# Patient Record
Sex: Female | Born: 1950
Health system: Southern US, Community
[De-identification: ages and names within clinical notes are randomized; demographics above are authoritative.]

## PROBLEM LIST (undated history)

## (undated) DIAGNOSIS — T7840XA Allergy, unspecified, initial encounter: Secondary | ICD-10-CM

## (undated) DIAGNOSIS — I10 Essential (primary) hypertension: Secondary | ICD-10-CM

## (undated) HISTORY — PX: BREAST LUMPECTOMY: SHX2

## (undated) HISTORY — DX: Allergy, unspecified, initial encounter: T78.40XA

## (undated) HISTORY — DX: Essential (primary) hypertension: I10

---
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
## 1975-11-06 HISTORY — PX: TUBAL LIGATION: SHX77

## 1985-11-05 HISTORY — PX: CHOLECYSTECTOMY: SHX55

## 1991-11-06 HISTORY — PX: MYOMECTOMY: SHX85

## 2003-03-15 ENCOUNTER — Inpatient Hospital Stay (HOSPITAL_COMMUNITY): Admission: RE | Admit: 2003-03-15 | Discharge: 2003-03-17 | Payer: Self-pay | Admitting: Orthopedic Surgery

## 2003-03-15 ENCOUNTER — Encounter: Payer: Self-pay | Admitting: Orthopedic Surgery

## 2004-07-14 ENCOUNTER — Ambulatory Visit (HOSPITAL_COMMUNITY): Admission: RE | Admit: 2004-07-14 | Discharge: 2004-07-14 | Payer: Self-pay | Admitting: Gastroenterology

## 2007-04-10 ENCOUNTER — Ambulatory Visit: Payer: Self-pay

## 2008-11-05 HISTORY — PX: FOOT SURGERY: SHX648

## 2008-12-21 ENCOUNTER — Emergency Department: Payer: Self-pay | Admitting: Emergency Medicine

## 2011-02-17 ENCOUNTER — Emergency Department: Payer: Self-pay | Admitting: Internal Medicine

## 2011-07-05 ENCOUNTER — Other Ambulatory Visit: Payer: Self-pay | Admitting: Cardiology

## 2011-07-05 DIAGNOSIS — I6789 Other cerebrovascular disease: Secondary | ICD-10-CM

## 2011-07-06 ENCOUNTER — Other Ambulatory Visit (INDEPENDENT_AMBULATORY_CARE_PROVIDER_SITE_OTHER): Payer: 59 | Admitting: *Deleted

## 2011-07-06 DIAGNOSIS — R42 Dizziness and giddiness: Secondary | ICD-10-CM

## 2011-07-06 DIAGNOSIS — I6789 Other cerebrovascular disease: Secondary | ICD-10-CM

## 2011-07-11 ENCOUNTER — Encounter (HOSPITAL_COMMUNITY): Payer: Self-pay | Admitting: Neurology

## 2011-12-12 ENCOUNTER — Other Ambulatory Visit: Payer: Self-pay | Admitting: Internal Medicine

## 2011-12-18 ENCOUNTER — Ambulatory Visit
Admission: RE | Admit: 2011-12-18 | Discharge: 2011-12-18 | Disposition: A | Discharge status: Home / Self Care | Payer: 59 | Source: Ambulatory Visit | Attending: Internal Medicine | Admitting: Internal Medicine

## 2012-02-21 ENCOUNTER — Other Ambulatory Visit: Payer: Self-pay | Admitting: Internal Medicine

## 2012-02-21 ENCOUNTER — Ambulatory Visit
Admission: RE | Admit: 2012-02-21 | Discharge: 2012-02-21 | Disposition: A | Discharge status: Home / Self Care | Payer: 59 | Source: Ambulatory Visit | Attending: Internal Medicine | Admitting: Internal Medicine

## 2012-02-21 DIAGNOSIS — M795 Residual foreign body in soft tissue: Secondary | ICD-10-CM

## 2014-09-23 ENCOUNTER — Ambulatory Visit
Admission: RE | Admit: 2014-09-23 | Discharge: 2014-09-23 | Disposition: A | Discharge status: Home / Self Care | Payer: No Typology Code available for payment source | Source: Ambulatory Visit | Attending: Internal Medicine | Admitting: Internal Medicine

## 2014-09-23 ENCOUNTER — Other Ambulatory Visit: Payer: Self-pay | Admitting: Internal Medicine

## 2014-09-23 DIAGNOSIS — J208 Acute bronchitis due to other specified organisms: Secondary | ICD-10-CM

## 2015-02-04 ENCOUNTER — Encounter (INDEPENDENT_AMBULATORY_CARE_PROVIDER_SITE_OTHER): Payer: Self-pay

## 2015-02-04 ENCOUNTER — Ambulatory Visit (INDEPENDENT_AMBULATORY_CARE_PROVIDER_SITE_OTHER)
Admission: RE | Admit: 2015-02-04 | Discharge: 2015-02-04 | Disposition: A | Discharge status: Home / Self Care | Payer: No Typology Code available for payment source | Source: Ambulatory Visit | Attending: Internal Medicine | Admitting: Internal Medicine

## 2015-02-04 ENCOUNTER — Encounter: Payer: Self-pay | Admitting: Internal Medicine

## 2015-02-04 ENCOUNTER — Ambulatory Visit (INDEPENDENT_AMBULATORY_CARE_PROVIDER_SITE_OTHER): Payer: No Typology Code available for payment source | Admitting: Internal Medicine

## 2015-02-04 VITALS — BP 126/80 | HR 90 | Ht 62.0 in | Wt 248.0 lb

## 2015-02-04 DIAGNOSIS — R7611 Nonspecific reaction to tuberculin skin test without active tuberculosis: Secondary | ICD-10-CM | POA: Insufficient documentation

## 2015-02-04 DIAGNOSIS — A319 Mycobacterial infection, unspecified: Secondary | ICD-10-CM | POA: Diagnosis not present

## 2015-02-04 NOTE — Assessment & Plan Note (Addendum)
Documented for record keeping for now. Recent liver functions were normal but assume she may be at increased risk of altered liver enzymes from medication exposures.

## 2015-02-04 NOTE — Assessment & Plan Note (Signed)
Probably not the cause of positive TB assays and may not be an active infection. We will watch for evidence of ongoing infection, but she is symptom free now after INH 2 months, and erythromycin 2 months. She is not tolerating eryth due to persistent GI upset. No obvious cardiac problem yet from prolonged macrolide therapy. Plan- stop eryth and observe.

## 2015-02-04 NOTE — Progress Notes (Signed)
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 Because of persistent cough this winter chest x-ray was done showing mild central bronchitis with a 7 mm granuloma in the right midlung zone. PPD skin test was strongly positive and Quant interferon gold assay was positive with no known history of TB exposure. She took 2 months of INH but when sputum culture returned showing atypical AFB, treatment was changed to erythromycin. On erythromycin, usually only tolerating two of 3 prescribed tablets daily because of persistent diarrhea. Bronchitis symptoms have now completely cleared with no cough or sputum. She denies night sweats unless she misses her hormone supplement. Weight has increased. No adenopathy. 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 Recent liver functions normal. She says promptly after starting INH a small skin lesion on her right upper anterior chest, and a nodule on her thumb both resolved. She has no known history of TB contact in self or family. CXR 09/24/14 FINDINGS: Cardiomediastinal silhouette is unremarkable. No acute infiltrate or pleural effusion. No pulmonary edema. Minimal central bronchitic changes. Calcified granuloma in right midlung laterally measures 7 mm. Mild degenerative changes mid and lower thoracic spine. IMPRESSION: No acute infiltrate or pulmonary edema. Minimal central bronchitic changes. Electronically Signed  By: Lahoma Crocker M.D.  On: 09/24/2014 08:42  Prior to Admission medications   Medication Sig Start Date End Date Taking? Authorizing Provider  cyanocobalamin (,VITAMIN B-12,) 1000 MCG/ML injection Injection weekly 01/11/15  Yes Historical Provider, MD  ERY-TAB 333 MG EC tablet Take 1 tablet by mouth 3 (three) times daily. 01/21/15  Yes Historical Provider, MD  levothyroxine (SYNTHROID, LEVOTHROID) 112 MCG tablet Take 1 tablet  by mouth daily. 11/15/14  Yes Historical Provider, MD  telmisartan (MICARDIS) 40 MG tablet Take 40 mg by mouth daily.   Yes Historical Provider, MD   Past Medical History  Diagnosis Date  . Hypertension   . Allergy    Past Surgical History  Procedure Laterality Date  . Cholecystectomy  1987  . Tubal ligation  1977  . Breast lumpectomy  1970's   Family History  Problem Relation Age of Onset  . Lung cancer Father     smoker  . Allergies      mother and father side of family  . Diabetes Brother    History   Social History  . Marital Status: Divorced    Spouse Name: N/A  . Number of Children: 0  . Years of Education: N/A   Occupational History  . grief specalist    Social History Main Topics  . Smoking status: Never Smoker   . Smokeless tobacco: Not on file  . Alcohol Use: 0.0 oz/week    0 Standard drinks or equivalent per week     Comment: social(maybe 2 a year)  . Drug Use: No  . Sexual Activity: Not on file   Other Topics Concern  . Not on file   Social History Narrative  . No narrative on file   ROS-see HPI   Negative unless "+" Constitutional:    weight loss, night sweats, fevers, chills, fatigue, lassitude. HEENT:    headaches, difficulty swallowing, tooth/dental problems, sore throat,       sneezing, itching, ear ache, nasal congestion, post nasal drip, snoring CV:    chest pain, orthopnea, PND, swelling in lower extremities, anasarca,  dizziness, palpitations Resp:   shortness of breath with exertion or at rest.                productive cough,   non-productive cough, coughing up of blood.              change in color of mucus.  wheezing.   Skin:    rash or lesions. GI:  No-   heartburn, indigestion, abdominal pain, nausea, vomiting, diarrhea,                 change in bowel habits, loss of appetite GU: dysuria, change in color of urine, no urgency or frequency.   flank pain. MS:   joint pain, stiffness, decreased range of  motion, back pain. Neuro-     nothing unusual Psych:  change in mood or affect.  depression or anxiety.   memory loss.  OBJ- Physical Exam General- Alert, Oriented, Affect-appropriate, Distress- none acute, overweight, looks well Skin- rash-none, lesions- none, excoriation- none Lymphadenopathy- none Head- atraumatic            Eyes- Gross vision intact, PERRLA, conjunctivae and secretions clear            Ears- Hearing, canals-normal            Nose- Clear, no-Septal dev, mucus, polyps, erosion, perforation             Throat- Mallampati II , mucosa clear , drainage- none, tonsils- atrophic Neck- flexible , trachea midline, no stridor , thyroid nl, carotid no bruit Chest - symmetrical excursion , unlabored           Heart/CV- RRR , no murmur , no gallop  , no rub, nl s1 s2                           - JVD- none , edema- none, stasis changes- none, varices- none           Lung- clear to P&A, wheeze- none, cough- none , dullness-none, rub- none           Chest wall-  Abd- soft, no obvious HSM Br/ Gen/ Rectal- Not done, not indicated Extrem- cyanosis- none, clubbing, none, atrophy- none, strength- nl Neuro- grossly intact to observation

## 2015-02-04 NOTE — Assessment & Plan Note (Addendum)
Granuloma seen on chest x-ray may reflect old tuberculosis infection. At age 64 and with history of porphyria complicated in the past by liver disease, we will watch for evidence of active TB but not try to complete an INH prophylaxis treatment course for now. I told her that if there were any questions we would refer her to infectious disease and that option remains available. No evidence of extrapulmonary infection with tuberculosis at this time.

## 2015-02-04 NOTE — Patient Instructions (Signed)
Order- CXR  Dx atypical AFB  Ok to stop erythromycin now and watch symptoms. I especially want to hear if you begin coughing again, have night sweats, swollen nodes at neck, armpit or groin, or rash.

## 2015-02-10 NOTE — Progress Notes (Signed)
Quick Note:  Called and spoke to pt. Informed pt of the results and recs per CY. Pt verbalized understanding and denied any further questions or concerns at this time. ______ 

## 2015-04-07 ENCOUNTER — Ambulatory Visit (INDEPENDENT_AMBULATORY_CARE_PROVIDER_SITE_OTHER): Payer: No Typology Code available for payment source | Admitting: Internal Medicine

## 2015-04-07 ENCOUNTER — Encounter: Payer: Self-pay | Admitting: Internal Medicine

## 2015-04-07 VITALS — BP 126/72 | HR 85 | Ht 62.0 in | Wt 246.0 lb

## 2015-04-07 DIAGNOSIS — A319 Mycobacterial infection, unspecified: Secondary | ICD-10-CM | POA: Diagnosis not present

## 2015-04-07 DIAGNOSIS — R7611 Nonspecific reaction to tuberculin skin test without active tuberculosis: Secondary | ICD-10-CM | POA: Diagnosis not present

## 2015-04-07 NOTE — Progress Notes (Signed)
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 Because of persistent cough this winter chest x-ray was done showing mild central bronchitis with a 7 mm granuloma in the right midlung zone. 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 She took 2 months of INH but when sputum culture returned showing atypical AFB, treatment was changed to erythromycin. On erythromycin, usually only tolerating two of 3 prescribed tablets daily because of persistent diarrhea. Bronchitis symptoms have now completely cleared with no cough or sputum. She denies night sweats unless she misses her hormone supplement. Weight has increased. No adenopathy. 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 Recent liver functions normal. She says promptly after starting INH a small skin lesion on her right upper anterior chest, and a nodule on her thumb both resolved. She has no known history of TB contact in self or family. CXR 09/24/14 FINDINGS: Cardiomediastinal silhouette is unremarkable. No acute infiltrate or pleural effusion. No pulmonary edema. Minimal central bronchitic changes. Calcified granuloma in right midlung laterally measures 7 mm. Mild degenerative changes mid and lower thoracic spine. IMPRESSION: No acute infiltrate or pulmonary edema. Minimal central bronchitic changes. Electronically Signed  By: Lahoma Crocker M.D.  On: 09/24/2014 08:42  04/07/15- 39 yoF never smoker referred courtesy of  Dr Sharol Roussel; Mycobacterium mucogenicum, +PPD, + Quant TB Gold (Had 2 months INH) Reports: cough at times; hot flashes Weight stable Some mild dry cough. No bad cold since last Fall. Few distinct hot flashes with sweats a few weeks ago. No ongoing night sweat, nodes or discolored sputum. CXR 02/04/15 IMPRESSION: 1. Stable  small peripheral right mid lung field nodule. No change from prior exam. Stable left base subsegmental atelectasis and/or scarring. 2. No acute cardiopulmonary disease. Electronically Signed  By: Marcello Moores Register  On: 02/04/2015 13:06  ROS-see HPI   Negative unless "+" Constitutional:    weight loss, night sweats, fevers, chills, fatigue, lassitude. HEENT:    headaches, difficulty swallowing, tooth/dental problems, sore throat,       sneezing, itching, ear ache, nasal congestion, post nasal drip, snoring CV:    chest pain, orthopnea, PND, swelling in lower extremities, anasarca,                                  dizziness, palpitations Resp:   shortness of breath with exertion or at rest.                productive cough,   non-productive cough, coughing up of blood.              change in color of mucus.  wheezing.   Skin:    rash or lesions. GI:  No-   heartburn, indigestion, abdominal pain, nausea, vomiting,  GU:  MS:   joint pain, stiffness,  Neuro-     nothing unusual Psych:  change in mood or affect.  depression or anxiety.   memory loss.  OBJ- Physical Exam General- Alert, Oriented, Affect-appropriate, Distress- none acute, overweight, looks well Skin- rash-none, lesions- none, excoriation- none Lymphadenopathy- none Head- atraumatic            Eyes- Gross vision intact, PERRLA, conjunctivae and secretions clear            Ears- Hearing, canals-normal  Nose- Clear, no-Septal dev, mucus, polyps, erosion, perforation             Throat- Mallampati II , mucosa clear , drainage- none, tonsils- atrophic Neck- flexible , trachea midline, no stridor , thyroid nl, carotid no bruit Chest - symmetrical excursion , unlabored           Heart/CV- RRR , no murmur , no gallop  , no rub, nl s1 s2                           - JVD- none , edema- none, stasis changes- none, varices- none           Lung- clear to P&A, wheeze- none, cough- none , dullness-none, rub- none            Chest wall-  Abd-  Br/ Gen/ Rectal- Not done, not indicated Extrem- cyanosis- none, clubbing, none, atrophy- none, strength- nl Neuro- grossly intact to observation

## 2015-04-07 NOTE — Assessment & Plan Note (Signed)
Non-specific hot flashes. I very much doubt active infection otherwise. We discussed and will continue to watch conservatively.

## 2015-04-07 NOTE — Assessment & Plan Note (Signed)
We continue to watch. Today discussed symptoms of active pulmonary infection.  Plan- We discussed and chose not to resume the INH prophy to complete at least 6 months.

## 2015-04-07 NOTE — Patient Instructions (Signed)
Please call if we can help 

## 2015-07-27 DIAGNOSIS — H40053 Ocular hypertension, bilateral: Secondary | ICD-10-CM | POA: Diagnosis not present

## 2015-07-27 DIAGNOSIS — H2513 Age-related nuclear cataract, bilateral: Secondary | ICD-10-CM | POA: Diagnosis not present

## 2015-08-24 DIAGNOSIS — Z024 Encounter for examination for driving license: Secondary | ICD-10-CM | POA: Diagnosis not present

## 2015-08-24 DIAGNOSIS — Z124 Encounter for screening for malignant neoplasm of cervix: Secondary | ICD-10-CM | POA: Diagnosis not present

## 2015-10-06 DIAGNOSIS — E039 Hypothyroidism, unspecified: Secondary | ICD-10-CM | POA: Diagnosis not present

## 2015-10-06 DIAGNOSIS — N951 Menopausal and female climacteric states: Secondary | ICD-10-CM | POA: Diagnosis not present

## 2015-10-06 DIAGNOSIS — E279 Disorder of adrenal gland, unspecified: Secondary | ICD-10-CM | POA: Diagnosis not present

## 2015-10-06 DIAGNOSIS — E559 Vitamin D deficiency, unspecified: Secondary | ICD-10-CM | POA: Diagnosis not present

## 2015-10-06 DIAGNOSIS — R739 Hyperglycemia, unspecified: Secondary | ICD-10-CM | POA: Diagnosis not present

## 2015-10-06 DIAGNOSIS — E641 Sequelae of vitamin A deficiency: Secondary | ICD-10-CM | POA: Diagnosis not present

## 2015-10-06 DIAGNOSIS — M25569 Pain in unspecified knee: Secondary | ICD-10-CM | POA: Diagnosis not present

## 2015-10-07 ENCOUNTER — Ambulatory Visit (INDEPENDENT_AMBULATORY_CARE_PROVIDER_SITE_OTHER): Payer: Medicare Other | Admitting: Internal Medicine

## 2015-10-07 ENCOUNTER — Encounter: Payer: Self-pay | Admitting: Internal Medicine

## 2015-10-07 VITALS — BP 116/70 | HR 88 | Ht 62.0 in | Wt 265.4 lb

## 2015-10-07 DIAGNOSIS — A319 Mycobacterial infection, unspecified: Secondary | ICD-10-CM | POA: Diagnosis not present

## 2015-10-07 DIAGNOSIS — R7611 Nonspecific reaction to tuberculin skin test without active tuberculosis: Secondary | ICD-10-CM | POA: Diagnosis not present

## 2015-10-07 NOTE — Assessment & Plan Note (Signed)
Clinically she is not acting as if she has a progressive infection and chest x-ray in April supports that impression.

## 2015-10-07 NOTE — Assessment & Plan Note (Signed)
No evidence of active mycobacterial disease

## 2015-10-07 NOTE — Patient Instructions (Signed)
We will follow along for now. If you stay good we won't have to do more.  Please call as needed

## 2015-10-07 NOTE — Progress Notes (Signed)
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 Because of persistent cough this winter chest x-ray was done showing mild central bronchitis with a 7 mm granuloma in the right midlung zone. 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 She took 2 months of INH but when sputum culture returned showing atypical AFB, treatment was changed to erythromycin. On erythromycin, usually only tolerating two of 3 prescribed tablets daily because of persistent diarrhea. Bronchitis symptoms have now completely cleared with no cough or sputum. She denies night sweats unless she misses her hormone supplement. Weight has increased. No adenopathy. 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 Recent liver functions normal. She says promptly after starting INH a small skin lesion on her right upper anterior chest, and a nodule on her thumb both resolved. She has no known history of TB contact in self or family. CXR 09/24/14 FINDINGS: Cardiomediastinal silhouette is unremarkable. No acute infiltrate or pleural effusion. No pulmonary edema. Minimal central bronchitic changes. Calcified granuloma in right midlung laterally measures 7 mm. Mild degenerative changes mid and lower thoracic spine. IMPRESSION: No acute infiltrate or pulmonary edema. Minimal central bronchitic changes. Electronically Signed  By: Lahoma Crocker M.D.  On: 09/24/2014 08:42  04/07/15- 43 yoF never smoker referred courtesy of  Dr Sharol Roussel; Mycobacterium mucogenicum, +PPD, + Quant TB Gold (Had 2 months INH) Reports: cough at times; hot flashes Weight stable Some mild dry cough. No bad cold since last Fall. Few distinct hot flashes with sweats a few weeks ago. No ongoing night sweat, nodes or discolored sputum. CXR 02/04/15 IMPRESSION: 1. Stable  small peripheral right mid lung field nodule. No change from prior exam. Stable left base subsegmental atelectasis and/or scarring. 2. No acute cardiopulmonary disease. Electronically Signed  By: Marcello Moores Register  On: 02/04/2015 13:06  10/07/15-  23 yoF never smoker referred courtesy of  Dr Sharol Roussel; Mycobacterium mucogenicum, +PPD, + Quant TB Gold (Had 2 months INH) complicated by porphyria cutanea tarda FOLLOWS FOR Pt states that cough is 90% better since last visit. Cough semi-productive with milky phlegm. Pt refused flu vaccine  Continues to work with Dr. Sharol Roussel. Doing much better. Little cough now with scant white phlegm. No acute events. Feels well. Typically in November/December she'll get a bad chest cold which lingers but that has not happened so far.  ROS-see HPI   Negative unless "+" Constitutional:    weight loss, night sweats, fevers, chills, fatigue, lassitude. HEENT:    headaches, difficulty swallowing, tooth/dental problems, sore throat,       sneezing, itching, ear ache, nasal congestion, post nasal drip, snoring CV:    chest pain, orthopnea, PND, swelling in lower extremities, anasarca,                                                            dizziness, palpitations Resp:   shortness of breath with exertion or at rest.                productive cough,   non-productive cough, coughing up of blood.              change in color of mucus.  wheezing.   Skin:    rash or lesions. GI:  No-   heartburn, indigestion, abdominal pain, nausea, vomiting,  GU:  MS:   joint pain, stiffness,  Neuro-     nothing unusual Psych:  change in mood or affect.  depression or anxiety.   memory loss.  OBJ- Physical Exam General- Alert, Oriented, Affect-appropriate, Distress- none acute, overweight, looks well Skin- rash-none, lesions- none, excoriation- none Lymphadenopathy- none Head- atraumatic            Eyes- Gross vision intact, PERRLA, conjunctivae and secretions clear             Ears- Hearing, canals-normal            Nose- Clear, no-Septal dev, mucus, polyps, erosion, perforation             Throat- Mallampati II , mucosa clear , drainage- none, tonsils- atrophic Neck- flexible , trachea midline, no stridor , thyroid nl, carotid no bruit Chest - symmetrical excursion , unlabored           Heart/CV- RRR , no murmur , no gallop  , no rub, nl s1 s2                           - JVD- none , edema- none, stasis changes- none, varices- none           Lung- clear to P&A, wheeze- none, cough- none , dullness-none, rub- none           Chest wall-  Abd-  Br/ Gen/ Rectal- Not done, not indicated Extrem- cyanosis- none, clubbing, none, atrophy- none, strength- nl Neuro- grossly intact to observation

## 2015-11-03 DIAGNOSIS — Z1231 Encounter for screening mammogram for malignant neoplasm of breast: Secondary | ICD-10-CM | POA: Diagnosis not present

## 2015-11-08 ENCOUNTER — Emergency Department (HOSPITAL_BASED_OUTPATIENT_CLINIC_OR_DEPARTMENT_OTHER)
Admission: EM | Admit: 2015-11-08 | Discharge: 2015-11-08 | Disposition: A | Discharge status: Home / Self Care | Payer: PPO | Attending: Emergency Medicine | Admitting: Emergency Medicine

## 2015-11-08 ENCOUNTER — Emergency Department (HOSPITAL_BASED_OUTPATIENT_CLINIC_OR_DEPARTMENT_OTHER): Payer: PPO

## 2015-11-08 ENCOUNTER — Encounter (HOSPITAL_BASED_OUTPATIENT_CLINIC_OR_DEPARTMENT_OTHER): Payer: Self-pay | Admitting: *Deleted

## 2015-11-08 DIAGNOSIS — S6992XA Unspecified injury of left wrist, hand and finger(s), initial encounter: Secondary | ICD-10-CM | POA: Diagnosis not present

## 2015-11-08 DIAGNOSIS — Z7982 Long term (current) use of aspirin: Secondary | ICD-10-CM | POA: Diagnosis not present

## 2015-11-08 DIAGNOSIS — Y9289 Other specified places as the place of occurrence of the external cause: Secondary | ICD-10-CM | POA: Insufficient documentation

## 2015-11-08 DIAGNOSIS — Z79899 Other long term (current) drug therapy: Secondary | ICD-10-CM | POA: Insufficient documentation

## 2015-11-08 DIAGNOSIS — Y998 Other external cause status: Secondary | ICD-10-CM | POA: Diagnosis not present

## 2015-11-08 DIAGNOSIS — S199XXA Unspecified injury of neck, initial encounter: Secondary | ICD-10-CM | POA: Diagnosis not present

## 2015-11-08 DIAGNOSIS — W109XXA Fall (on) (from) unspecified stairs and steps, initial encounter: Secondary | ICD-10-CM

## 2015-11-08 DIAGNOSIS — S63502A Unspecified sprain of left wrist, initial encounter: Secondary | ICD-10-CM | POA: Diagnosis not present

## 2015-11-08 DIAGNOSIS — I10 Essential (primary) hypertension: Secondary | ICD-10-CM | POA: Diagnosis not present

## 2015-11-08 DIAGNOSIS — W108XXA Fall (on) (from) other stairs and steps, initial encounter: Secondary | ICD-10-CM | POA: Diagnosis not present

## 2015-11-08 DIAGNOSIS — Y9389 Activity, other specified: Secondary | ICD-10-CM | POA: Insufficient documentation

## 2015-11-08 DIAGNOSIS — M25532 Pain in left wrist: Secondary | ICD-10-CM | POA: Diagnosis not present

## 2015-11-08 DIAGNOSIS — S060X1A Concussion with loss of consciousness of 30 minutes or less, initial encounter: Secondary | ICD-10-CM | POA: Insufficient documentation

## 2015-11-08 DIAGNOSIS — S0990XA Unspecified injury of head, initial encounter: Secondary | ICD-10-CM | POA: Diagnosis not present

## 2015-11-08 MED ORDER — IBUPROFEN 600 MG PO TABS: 600.0000 mg | ORAL_TABLET | Freq: Four times a day (QID) | ORAL | Status: DC | PRN

## 2015-11-08 MED ORDER — HYDROCODONE-ACETAMINOPHEN 5-325 MG PO TABS: 1.0000 | ORAL_TABLET | ORAL | Status: DC | PRN

## 2015-11-08 MED ORDER — ORPHENADRINE CITRATE ER 100 MG PO TB12: 100.0000 mg | ORAL_TABLET | Freq: Two times a day (BID) | ORAL | Status: DC

## 2015-11-08 NOTE — ED Notes (Signed)
PA Sanders at bedside  

## 2015-11-08 NOTE — ED Notes (Signed)
Last night she fell off the last 2 steps while backing off a ladder, she hit her head on a wooden door. LOC. She is alert oriented. Has been dizzy and unsteady on her feet since the fall.

## 2015-11-08 NOTE — Discharge Instructions (Signed)
Concussion, Adult A concussion, or closed-head injury, is a brain injury caused by a direct blow to the head or by a quick and sudden movement (jolt) of the head or neck. Concussions are usually not life-threatening. Even so, the effects of a concussion can be serious. If you have had a concussion before, you are more likely to experience concussion-like symptoms after a direct blow to the head.  CAUSES  Direct blow to the head, such as from running into another player during a soccer game, being hit in a fight, or hitting your head on a hard surface.  A jolt of the head or neck that causes the brain to move back and forth inside the skull, such as in a car crash. SIGNS AND SYMPTOMS The signs of a concussion can be hard to notice. Early on, they may be missed by you, family members, and health care providers. You may look fine but act or feel differently. Symptoms are usually temporary, but they may last for days, weeks, or even longer. Some symptoms may appear right away while others may not show up for hours or days. Every head injury is different. Symptoms include:  Mild to moderate headaches that will not go away.  A feeling of pressure inside your head.  Having more trouble than usual:  Learning or remembering things you have heard.  Answering questions.  Paying attention or concentrating.  Organizing daily tasks.  Making decisions and solving problems.  Slowness in thinking, acting or reacting, speaking, or reading.  Getting lost or being easily confused.  Feeling tired all the time or lacking energy (fatigued).  Feeling drowsy.  Sleep disturbances.  Sleeping more than usual.  Sleeping less than usual.  Trouble falling asleep.  Trouble sleeping (insomnia).  Loss of balance or feeling lightheaded or dizzy.  Nausea or vomiting.  Numbness or tingling.  Increased sensitivity to:  Sounds.  Lights.  Distractions.  Vision problems or eyes that tire  easily.  Diminished sense of taste or smell.  Ringing in the ears.  Mood changes such as feeling sad or anxious.  Becoming easily irritated or angry for little or no reason.  Lack of motivation.  Seeing or hearing things other people do not see or hear (hallucinations). DIAGNOSIS Your health care provider can usually diagnose a concussion based on a description of your injury and symptoms. He or she will ask whether you passed out (lost consciousness) and whether you are having trouble remembering events that happened right before and during your injury. Your evaluation might include:  A brain scan to look for signs of injury to the brain. Even if the test shows no injury, you may still have a concussion.  Blood tests to be sure other problems are not present. TREATMENT  Concussions are usually treated in an emergency department, in urgent care, or at a clinic. You may need to stay in the hospital overnight for further treatment.  Tell your health care provider if you are taking any medicines, including prescription medicines, over-the-counter medicines, and natural remedies. Some medicines, such as blood thinners (anticoagulants) and aspirin, may increase the chance of complications. Also tell your health care provider whether you have had alcohol or are taking illegal drugs. This information may affect treatment.  Your health care provider will send you home with important instructions to follow.  How fast you will recover from a concussion depends on many factors. These factors include how severe your concussion is, what part of your brain was injured,  your age, and how healthy you were before the concussion. °· Most people with mild injuries recover fully. Recovery can take time. In general, recovery is slower in older persons. Also, persons who have had a concussion in the past or have other medical problems may find that it takes longer to recover from their current injury. °HOME  CARE INSTRUCTIONS °General Instructions °· Carefully follow the directions your health care provider gave you. °· Only take over-the-counter or prescription medicines for pain, discomfort, or fever as directed by your health care provider. °· Take only those medicines that your health care provider has approved. °· Do not drink alcohol until your health care provider says you are well enough to do so. Alcohol and certain other drugs may slow your recovery and can put you at risk of further injury. °· If it is harder than usual to remember things, write them down. °· If you are easily distracted, try to do one thing at a time. For example, do not try to watch TV while fixing dinner. °· Talk with family members or close friends when making important decisions. °· Keep all follow-up appointments. Repeated evaluation of your symptoms is recommended for your recovery. °· Watch your symptoms and tell others to do the same. Complications sometimes occur after a concussion. Older adults with a brain injury may have a higher risk of serious complications, such as a blood clot on the brain. °· Tell your teachers, school nurse, school counselor, coach, athletic trainer, or work manager about your injury, symptoms, and restrictions. Tell them about what you can or cannot do. They should watch for: °¨ Increased problems with attention or concentration. °¨ Increased difficulty remembering or learning new information. °¨ Increased time needed to complete tasks or assignments. °¨ Increased irritability or decreased ability to cope with stress. °¨ Increased symptoms. °· Rest. Rest helps the brain to heal. Make sure you: °¨ Get plenty of sleep at night. Avoid staying up late at night. °¨ Keep the same bedtime hours on weekends and weekdays. °¨ Rest during the day. Take daytime naps or rest breaks when you feel tired. °· Limit activities that require a lot of thought or concentration. These include: °¨ Doing homework or job-related  work. °¨ Watching TV. °¨ Working on the computer. °· Avoid any situation where there is potential for another head injury (football, hockey, soccer, basketball, martial arts, downhill snow sports and horseback riding). Your condition will get worse every time you experience a concussion. You should avoid these activities until you are evaluated by the appropriate follow-up health care providers. °Returning To Your Regular Activities °You will need to return to your normal activities slowly, not all at once. You must give your body and brain enough time for recovery. °· Do not return to sports or other athletic activities until your health care provider tells you it is safe to do so. °· Ask your health care provider when you can drive, ride a bicycle, or operate heavy machinery. Your ability to react may be slower after a brain injury. Never do these activities if you are dizzy. °· Ask your health care provider about when you can return to work or school. °Preventing Another Concussion °It is very important to avoid another brain injury, especially before you have recovered. In rare cases, another injury can lead to permanent brain damage, brain swelling, or death. The risk of this is greatest during the first 7-10 days after a head injury. Avoid injuries by: °· Wearing a   seat belt when riding in a car.  Drinking alcohol only in moderation.  Wearing a helmet when biking, skiing, skateboarding, skating, or doing similar activities.  Avoiding activities that could lead to a second concussion, such as contact or recreational sports, until your health care provider says it is okay.  Taking safety measures in your home.  Remove clutter and tripping hazards from floors and stairways.  Use grab bars in bathrooms and handrails by stairs.  Place non-slip mats on floors and in bathtubs.  Improve lighting in dim areas. SEEK MEDICAL CARE IF:  You have increased problems paying attention or  concentrating.  You have increased difficulty remembering or learning new information.  You need more time to complete tasks or assignments than before.  You have increased irritability or decreased ability to cope with stress.  You have more symptoms than before. Seek medical care if you have any of the following symptoms for more than 2 weeks after your injury:  Lasting (chronic) headaches.  Dizziness or balance problems.  Nausea.  Vision problems.  Increased sensitivity to noise or light.  Depression or mood swings.  Anxiety or irritability.  Memory problems.  Difficulty concentrating or paying attention.  Sleep problems.  Feeling tired all the time. SEEK IMMEDIATE MEDICAL CARE IF:  You have severe or worsening headaches. These may be a sign of a blood clot in the brain.  You have weakness (even if only in one hand, leg, or part of the face).  You have numbness.  You have decreased coordination.  You vomit repeatedly.  You have increased sleepiness.  One pupil is larger than the other.  You have convulsions.  You have slurred speech.  You have increased confusion. This may be a sign of a blood clot in the brain.  You have increased restlessness, agitation, or irritability.  You are unable to recognize people or places.  You have neck pain.  It is difficult to wake you up.  You have unusual behavior changes.  You lose consciousness. MAKE SURE YOU:  Understand these instructions.  Will watch your condition.  Will get help right away if you are not doing well or get worse.   This information is not intended to replace advice given to you by your health care provider. Make sure you discuss any questions you have with your health care provider.   Document Released: 01/12/2004 Document Revised: 11/12/2014 Document Reviewed: 05/14/2013 Elsevier Interactive Patient Education 2016 [employer] Wrist Sprain A wrist sprain is a stretch or tear  in the strong, fibrous tissues (ligaments) that connect your wrist bones. The ligaments of your wrist may be easily sprained. There are three types of wrist sprains.  Grade 1. The ligament is not stretched or torn, but the sprain causes pain.  Grade 2. The ligament is stretched or partially torn. You may be able to move your wrist, but not very much.  Grade 3. The ligament or muscle completely tears. You may find it difficult or extremely painful to move your wrist even a little. CAUSES Often, wrist sprains are a result of a fall or an injury. The force of the impact causes the fibers of your ligament to stretch too much or tear. Common causes of wrist sprains include:  Overextending your wrist while catching a ball with your hands.  Repetitive or strenuous extension or bending of your wrist.  Landing on your hand during a fall. RISK FACTORS  Having previous wrist injuries.  Playing contact sports, such as  boxing or wrestling.  Participating in activities in which falling is common.  Having poor wrist strength and flexibility. SIGNS AND SYMPTOMS  Wrist pain.  Wrist tenderness.  Inflammation or bruising of the wrist area.  Hearing a "pop" or feeling a tear at the time of the injury.  Decreased wrist movement due to pain, stiffness, or weakness. DIAGNOSIS Your health care provider will examine your wrist. In some cases, an X-ray will be taken to make sure you did not break any bones. If your health care provider thinks that you tore a ligament, he or she may order an MRI of your wrist. TREATMENT Treatment involves resting and icing your wrist. You may also need to take pain medicines to help lessen pain and inflammation. Your health care provider may recommend keeping your wrist still (immobilized) with a splint to help your sprain heal. When the splint is no longer necessary, you may need to perform strengthening and stretching exercises. These exercises help you to regain  strength and full range of motion in your wrist. Surgery is not usually needed for wrist sprains unless the ligament completely tears. HOME CARE INSTRUCTIONS  Rest your wrist. Do not do things that cause pain.  Wear your wrist splint as directed by your health care provider.  Take medicines only as directed by your health care provider.  To ease pain and swelling, apply ice to the injured area.  Put ice in a plastic bag.  Place a towel between your skin and the bag.  Leave the ice on for 20 minutes, 2-3 times a day. SEEK MEDICAL CARE IF:  Your pain, discomfort, or swelling gets worse even with treatment.  You feel sudden numbness in your hand.   This information is not intended to replace advice given to you by your health care provider. Make sure you discuss any questions you have with your health care provider.   Document Released: 06/25/2014 Document Reviewed: 06/25/2014 Elsevier Interactive Patient Education [employer].

## 2015-11-08 NOTE — ED Provider Notes (Signed)
CSN: [id number]     Arrival date & time 11/08/15  1254 History   First MD Initiated Contact with Patient 11/08/15 1644     Chief Complaint  Patient presents with  . Fall  . Head Injury     (Consider location/radiation/quality/duration/timing/severity/associated sxs/prior Treatment) HPI Patient reports yesterday evening she was coming down some attic stairs carrying a large box. She slipped and lost her balance causing her to fall backwards. Patient reports that she hit her head on the door and temperature rarely her vision went black and then she saw stars. She reports she has had headaches since and felt nauseated. She reports that she's felt somewhat dizzy as well. She denies that she has had visual loss since then. She does however report that she feels like she needs to keep "adjusting her glasses". She reports she also has pain in her left wrist. It hurts with motion. She also reports both ankles are sore but she has been up and walking. He does not have paresthesia or weakness to the extremities. Past Medical History  Diagnosis Date  . Hypertension   . Allergy    Past Surgical History  Procedure Laterality Date  . Cholecystectomy  1987  . Tubal ligation  1977  . Breast lumpectomy  1970's   Family History  Problem Relation Age of Onset  . Lung cancer Father     smoker  . Allergies      mother and father side of family  . Diabetes Brother    Social History  Substance Use Topics  . Smoking status: Never Smoker   . Smokeless tobacco: None  . Alcohol Use: 0.0 oz/week    0 Standard drinks or equivalent per week     Comment: social(maybe 2 a year)   OB History    No data available     Review of Systems 10 Systems reviewed and are negative for acute change except as noted in the HPI.    Allergies  Sulfa antibiotics  Home Medications   Prior to Admission medications   Medication Sig Start Date End Date Taking? Authorizing Provider  aspirin 81 MG tablet Take 81 mg  by mouth daily.    Historical Provider, MD  cholecalciferol (VITAMIN D) 1000 UNITS tablet Take 1,000 Units by mouth daily.    Historical Provider, MD  cyanocobalamin (,VITAMIN B-12,) 1000 MCG/ML injection Injection weekly 01/11/15   Historical Provider, MD  Docosahexaenoic Acid (DHA PO) Take 10 mg by mouth daily.    Historical Provider, MD  estradiol (VIVELLE-DOT) 0.05 MG/24HR patch Place 1 patch onto the skin 2 (two) times a week.    Historical Provider, MD  hydrochlorothiazide (HYDRODIURIL) 12.5 MG tablet Take 12.5 mg by mouth daily.    Historical Provider, MD  HYDROcodone-acetaminophen (NORCO/VICODIN) 5-325 MG tablet Take 1-2 tablets by mouth every 4 (four) hours as needed for moderate pain or severe pain. 11/08/15   Charlesetta Shanks, MD  ibuprofen (ADVIL,MOTRIN) 600 MG tablet Take 1 tablet (600 mg total) by mouth every 6 (six) hours as needed. 11/08/15   Charlesetta Shanks, MD  levothyroxine (SYNTHROID, LEVOTHROID) 112 MCG tablet Take 1 tablet by mouth daily. 11/15/14   Historical Provider, MD  losartan (COZAAR) 100 MG tablet 100 mg daily. 08/18/15   Historical Provider, MD  magnesium gluconate (MAGONATE) 500 MG tablet Take 500 mg by mouth 2 (two) times daily.    Historical Provider, MD  Melatonin 1 MG TABS Take 1 mg by mouth at bedtime.    Historical  Provider, MD  Omega-3 Fatty Acids (OMEGA 3 PO) Take 1,200 mg by mouth daily.    Historical Provider, MD  orphenadrine (NORFLEX) 100 MG tablet Take 1 tablet (100 mg total) by mouth 2 (two) times daily. 11/08/15   Charlesetta Shanks, MD  progesterone 200 MG SUPP Place 200 mg vaginally at bedtime.    Historical Provider, MD  telmisartan (MICARDIS) 40 MG tablet Take 40 mg by mouth daily.    Historical Provider, MD  vitamin A 10000 UNIT capsule Take 10,000 Units by mouth daily.    Historical Provider, MD   BP 119/66 mmHg  Pulse 62  Temp(Src) 98.1 F (36.7 C) (Oral)  Resp 18  Ht 5\' 2"  (1.575 m)  Wt 263 lb 9 oz (119.551 kg)  BMI 48.19 kg/m2  SpO2 98% Physical Exam   Constitutional: She is oriented to person, place, and time.  Patient is alert and nontoxic. No respiratory distress. Moderate obesity otherwise good physical condition.  HENT:  Head: Normocephalic and atraumatic.  Right Ear: External ear normal.  Left Ear: External ear normal.  Nose: Nose normal.  Mouth/Throat: Oropharynx is clear and moist.  Eyes: EOM are normal. Pupils are equal, round, and reactive to light.  Neck: Neck supple.  Cardiovascular: Normal rate, regular rhythm, normal heart sounds and intact distal pulses.   Pulmonary/Chest: Effort normal and breath sounds normal.  Abdominal: Soft. Bowel sounds are normal. She exhibits no distension. There is no tenderness.  Musculoskeletal: Normal range of motion. She exhibits tenderness. She exhibits no edema.  Left wrist tender to flexion and extension. No gross deformity or effusion. Neurovascularly intact. Patient versus tenderness to palpation over the bony prominences of the left ankle however no significant effusion or deformity present.  Neurological: She is alert and oriented to person, place, and time. She has normal strength. No cranial nerve deficit. She exhibits normal muscle tone. Coordination normal. GCS eye subscore is 4. GCS verbal subscore is 5. GCS motor subscore is 6.  Skin: Skin is warm, dry and intact.  Psychiatric: She has a normal mood and affect.    ED Course  Procedures (including critical care time) Labs Review Labs Reviewed - No data to display  Imaging Review Dg Wrist Complete Left  11/08/2015  CLINICAL DATA:  Fall down stairs last night. Now with diffuse left wrist pain and limited range of motion. EXAM: LEFT WRIST - COMPLETE 3+ VIEW COMPARISON:  None. FINDINGS: No fracture or dislocation. The alignment and joint spaces are maintained. Scaphoid is intact. No focal soft tissue abnormality. IMPRESSION: No fracture or dislocation of the left wrist. Electronically Signed   By: Jeb Levering M.D.   On:  11/08/2015 18:13   Ct Head Wo Contrast  11/08/2015  CLINICAL DATA:  Last night pt states she fell off the last 2 steps while backing off a ladder, she hit her head on a wooden door. LOC, has been dizzy and unsteady on her feet since the fall EXAM: CT HEAD WITHOUT CONTRAST CT CERVICAL SPINE WITHOUT CONTRAST TECHNIQUE: Multidetector CT imaging of the head and cervical spine was performed following the standard protocol without intravenous contrast. Multiplanar CT image reconstructions of the cervical spine were also generated. COMPARISON:  CT 02/17/2011 FINDINGS: CT HEAD FINDINGS No intracranial hemorrhage. No parenchymal contusion. No midline shift or mass effect. Basilar cisterns are patent. No skull base fracture. No fluid in the paranasal sinuses or mastoid air cells. Orbits are normal. CT CERVICAL SPINE FINDINGS No prevertebral soft tissue swelling. Straightening of the  normal cervical lordosis. Normal alignment of cervical vertebral bodies. No loss of vertebral body height. Normal facet articulation. Normal craniocervical junction.No evidence epidural or paraspinal hematoma. Delete Multiple levels of endplate spurring and joint space narrowing. IMPRESSION: 1. No acute intracranial findings. 2. Cervical spine fracture. 3. 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 4. Straightening of the normal cervical lordosis may be secondary to position, muscle spasm, or ligamentous injury. Electronically Signed   By: Suzy Bouchard M.D.   On: 11/08/2015 18:20   Ct Cervical Spine Wo Contrast  11/08/2015  CLINICAL DATA:  Last night pt states she fell off the last 2 steps while backing off a ladder, she hit her head on a wooden door. LOC, has been dizzy and unsteady on her feet since the fall EXAM: CT HEAD WITHOUT CONTRAST CT CERVICAL SPINE WITHOUT CONTRAST TECHNIQUE: Multidetector CT imaging of the head and cervical spine was performed following the standard protocol without intravenous contrast.  Multiplanar CT image reconstructions of the cervical spine were also generated. COMPARISON:  CT 02/17/2011 FINDINGS: CT HEAD FINDINGS No intracranial hemorrhage. No parenchymal contusion. No midline shift or mass effect. Basilar cisterns are patent. No skull base fracture. No fluid in the paranasal sinuses or mastoid air cells. Orbits are normal. CT CERVICAL SPINE FINDINGS No prevertebral soft tissue swelling. Straightening of the normal cervical lordosis. Normal alignment of cervical vertebral bodies. No loss of vertebral body height. Normal facet articulation. Normal craniocervical junction.No evidence epidural or paraspinal hematoma. Delete Multiple levels of endplate spurring and joint space narrowing. IMPRESSION: 1. No acute intracranial findings. 2. Cervical spine fracture. 3. 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 4. Straightening of the normal cervical lordosis may be secondary to position, muscle spasm, or ligamentous injury. Electronically Signed   By: Suzy Bouchard M.D.   On: 11/08/2015 18:20   I have personally reviewed and evaluated these images and lab results as part of my medical decision-making.   EKG Interpretation None      MDM   Final diagnoses:  Fall on stairs, initial encounter  Concussion, with loss of consciousness of 30 minutes or less, initial encounter  Wrist sprain, left, initial encounter   Patient continued to have headache and dizziness after fall yesterday. CT does not show any intracranial injury. Neurologic examination is normal and intact. At this time patient will be given concussion precautions and medication for pain. She also has wrist pain consistent with sprain. No effusion is present and x-ray does not show acute abnormality. Patient will be counseled to follow up with her family physician this week for recheck. Return precautions are provided.    Charlesetta Shanks, MD 11/08/15 [id number]

## 2015-11-08 NOTE — ED Notes (Signed)
Patient transported to and from radiology department via stretcher. 

## 2015-11-29 DIAGNOSIS — H539 Unspecified visual disturbance: Secondary | ICD-10-CM | POA: Diagnosis not present

## 2015-12-07 DIAGNOSIS — M9902 Segmental and somatic dysfunction of thoracic region: Secondary | ICD-10-CM | POA: Diagnosis not present

## 2015-12-07 DIAGNOSIS — G44329 Chronic post-traumatic headache, not intractable: Secondary | ICD-10-CM | POA: Diagnosis not present

## 2015-12-07 DIAGNOSIS — M546 Pain in thoracic spine: Secondary | ICD-10-CM | POA: Diagnosis not present

## 2015-12-07 DIAGNOSIS — M9901 Segmental and somatic dysfunction of cervical region: Secondary | ICD-10-CM | POA: Diagnosis not present

## 2015-12-08 DIAGNOSIS — M546 Pain in thoracic spine: Secondary | ICD-10-CM | POA: Diagnosis not present

## 2015-12-08 DIAGNOSIS — G44329 Chronic post-traumatic headache, not intractable: Secondary | ICD-10-CM | POA: Diagnosis not present

## 2015-12-08 DIAGNOSIS — M9901 Segmental and somatic dysfunction of cervical region: Secondary | ICD-10-CM | POA: Diagnosis not present

## 2015-12-08 DIAGNOSIS — M9902 Segmental and somatic dysfunction of thoracic region: Secondary | ICD-10-CM | POA: Diagnosis not present

## 2015-12-12 DIAGNOSIS — M9902 Segmental and somatic dysfunction of thoracic region: Secondary | ICD-10-CM | POA: Diagnosis not present

## 2015-12-12 DIAGNOSIS — M546 Pain in thoracic spine: Secondary | ICD-10-CM | POA: Diagnosis not present

## 2015-12-12 DIAGNOSIS — G44329 Chronic post-traumatic headache, not intractable: Secondary | ICD-10-CM | POA: Diagnosis not present

## 2015-12-12 DIAGNOSIS — M9901 Segmental and somatic dysfunction of cervical region: Secondary | ICD-10-CM | POA: Diagnosis not present

## 2015-12-14 DIAGNOSIS — M9901 Segmental and somatic dysfunction of cervical region: Secondary | ICD-10-CM | POA: Diagnosis not present

## 2015-12-14 DIAGNOSIS — M9902 Segmental and somatic dysfunction of thoracic region: Secondary | ICD-10-CM | POA: Diagnosis not present

## 2015-12-14 DIAGNOSIS — M546 Pain in thoracic spine: Secondary | ICD-10-CM | POA: Diagnosis not present

## 2015-12-14 DIAGNOSIS — G44329 Chronic post-traumatic headache, not intractable: Secondary | ICD-10-CM | POA: Diagnosis not present

## 2015-12-15 DIAGNOSIS — M546 Pain in thoracic spine: Secondary | ICD-10-CM | POA: Diagnosis not present

## 2015-12-15 DIAGNOSIS — G44329 Chronic post-traumatic headache, not intractable: Secondary | ICD-10-CM | POA: Diagnosis not present

## 2015-12-15 DIAGNOSIS — M9901 Segmental and somatic dysfunction of cervical region: Secondary | ICD-10-CM | POA: Diagnosis not present

## 2015-12-15 DIAGNOSIS — M9902 Segmental and somatic dysfunction of thoracic region: Secondary | ICD-10-CM | POA: Diagnosis not present

## 2015-12-19 DIAGNOSIS — M9901 Segmental and somatic dysfunction of cervical region: Secondary | ICD-10-CM | POA: Diagnosis not present

## 2015-12-19 DIAGNOSIS — M9902 Segmental and somatic dysfunction of thoracic region: Secondary | ICD-10-CM | POA: Diagnosis not present

## 2015-12-19 DIAGNOSIS — M546 Pain in thoracic spine: Secondary | ICD-10-CM | POA: Diagnosis not present

## 2015-12-19 DIAGNOSIS — G44329 Chronic post-traumatic headache, not intractable: Secondary | ICD-10-CM | POA: Diagnosis not present

## 2015-12-21 DIAGNOSIS — G44329 Chronic post-traumatic headache, not intractable: Secondary | ICD-10-CM | POA: Diagnosis not present

## 2015-12-21 DIAGNOSIS — M546 Pain in thoracic spine: Secondary | ICD-10-CM | POA: Diagnosis not present

## 2015-12-21 DIAGNOSIS — M9901 Segmental and somatic dysfunction of cervical region: Secondary | ICD-10-CM | POA: Diagnosis not present

## 2015-12-21 DIAGNOSIS — M9902 Segmental and somatic dysfunction of thoracic region: Secondary | ICD-10-CM | POA: Diagnosis not present

## 2015-12-22 DIAGNOSIS — G44329 Chronic post-traumatic headache, not intractable: Secondary | ICD-10-CM | POA: Diagnosis not present

## 2015-12-22 DIAGNOSIS — M9901 Segmental and somatic dysfunction of cervical region: Secondary | ICD-10-CM | POA: Diagnosis not present

## 2015-12-22 DIAGNOSIS — M9902 Segmental and somatic dysfunction of thoracic region: Secondary | ICD-10-CM | POA: Diagnosis not present

## 2015-12-22 DIAGNOSIS — M546 Pain in thoracic spine: Secondary | ICD-10-CM | POA: Diagnosis not present

## 2015-12-26 DIAGNOSIS — M546 Pain in thoracic spine: Secondary | ICD-10-CM | POA: Diagnosis not present

## 2015-12-26 DIAGNOSIS — M9902 Segmental and somatic dysfunction of thoracic region: Secondary | ICD-10-CM | POA: Diagnosis not present

## 2015-12-26 DIAGNOSIS — G44329 Chronic post-traumatic headache, not intractable: Secondary | ICD-10-CM | POA: Diagnosis not present

## 2015-12-26 DIAGNOSIS — M9901 Segmental and somatic dysfunction of cervical region: Secondary | ICD-10-CM | POA: Diagnosis not present

## 2015-12-28 DIAGNOSIS — M9901 Segmental and somatic dysfunction of cervical region: Secondary | ICD-10-CM | POA: Diagnosis not present

## 2015-12-28 DIAGNOSIS — M9902 Segmental and somatic dysfunction of thoracic region: Secondary | ICD-10-CM | POA: Diagnosis not present

## 2015-12-28 DIAGNOSIS — G44329 Chronic post-traumatic headache, not intractable: Secondary | ICD-10-CM | POA: Diagnosis not present

## 2015-12-28 DIAGNOSIS — M546 Pain in thoracic spine: Secondary | ICD-10-CM | POA: Diagnosis not present

## 2015-12-29 DIAGNOSIS — M9902 Segmental and somatic dysfunction of thoracic region: Secondary | ICD-10-CM | POA: Diagnosis not present

## 2015-12-29 DIAGNOSIS — M546 Pain in thoracic spine: Secondary | ICD-10-CM | POA: Diagnosis not present

## 2015-12-29 DIAGNOSIS — M9901 Segmental and somatic dysfunction of cervical region: Secondary | ICD-10-CM | POA: Diagnosis not present

## 2015-12-29 DIAGNOSIS — G44329 Chronic post-traumatic headache, not intractable: Secondary | ICD-10-CM | POA: Diagnosis not present

## 2015-12-30 DIAGNOSIS — M9901 Segmental and somatic dysfunction of cervical region: Secondary | ICD-10-CM | POA: Diagnosis not present

## 2015-12-30 DIAGNOSIS — M9902 Segmental and somatic dysfunction of thoracic region: Secondary | ICD-10-CM | POA: Diagnosis not present

## 2015-12-30 DIAGNOSIS — M546 Pain in thoracic spine: Secondary | ICD-10-CM | POA: Diagnosis not present

## 2015-12-30 DIAGNOSIS — G44329 Chronic post-traumatic headache, not intractable: Secondary | ICD-10-CM | POA: Diagnosis not present

## 2016-01-02 DIAGNOSIS — M9902 Segmental and somatic dysfunction of thoracic region: Secondary | ICD-10-CM | POA: Diagnosis not present

## 2016-01-02 DIAGNOSIS — G44329 Chronic post-traumatic headache, not intractable: Secondary | ICD-10-CM | POA: Diagnosis not present

## 2016-01-02 DIAGNOSIS — M546 Pain in thoracic spine: Secondary | ICD-10-CM | POA: Diagnosis not present

## 2016-01-02 DIAGNOSIS — M9901 Segmental and somatic dysfunction of cervical region: Secondary | ICD-10-CM | POA: Diagnosis not present

## 2016-01-04 DIAGNOSIS — M546 Pain in thoracic spine: Secondary | ICD-10-CM | POA: Diagnosis not present

## 2016-01-04 DIAGNOSIS — M9902 Segmental and somatic dysfunction of thoracic region: Secondary | ICD-10-CM | POA: Diagnosis not present

## 2016-01-04 DIAGNOSIS — M9901 Segmental and somatic dysfunction of cervical region: Secondary | ICD-10-CM | POA: Diagnosis not present

## 2016-01-04 DIAGNOSIS — G44329 Chronic post-traumatic headache, not intractable: Secondary | ICD-10-CM | POA: Diagnosis not present

## 2016-01-05 DIAGNOSIS — M9902 Segmental and somatic dysfunction of thoracic region: Secondary | ICD-10-CM | POA: Diagnosis not present

## 2016-01-05 DIAGNOSIS — M9901 Segmental and somatic dysfunction of cervical region: Secondary | ICD-10-CM | POA: Diagnosis not present

## 2016-01-05 DIAGNOSIS — M546 Pain in thoracic spine: Secondary | ICD-10-CM | POA: Diagnosis not present

## 2016-01-05 DIAGNOSIS — G44329 Chronic post-traumatic headache, not intractable: Secondary | ICD-10-CM | POA: Diagnosis not present

## 2016-01-09 DIAGNOSIS — M9902 Segmental and somatic dysfunction of thoracic region: Secondary | ICD-10-CM | POA: Diagnosis not present

## 2016-01-09 DIAGNOSIS — G44329 Chronic post-traumatic headache, not intractable: Secondary | ICD-10-CM | POA: Diagnosis not present

## 2016-01-09 DIAGNOSIS — M546 Pain in thoracic spine: Secondary | ICD-10-CM | POA: Diagnosis not present

## 2016-01-09 DIAGNOSIS — M9901 Segmental and somatic dysfunction of cervical region: Secondary | ICD-10-CM | POA: Diagnosis not present

## 2016-01-11 DIAGNOSIS — M9901 Segmental and somatic dysfunction of cervical region: Secondary | ICD-10-CM | POA: Diagnosis not present

## 2016-01-11 DIAGNOSIS — M546 Pain in thoracic spine: Secondary | ICD-10-CM | POA: Diagnosis not present

## 2016-01-11 DIAGNOSIS — G44329 Chronic post-traumatic headache, not intractable: Secondary | ICD-10-CM | POA: Diagnosis not present

## 2016-01-11 DIAGNOSIS — M9902 Segmental and somatic dysfunction of thoracic region: Secondary | ICD-10-CM | POA: Diagnosis not present

## 2016-01-13 DIAGNOSIS — M9901 Segmental and somatic dysfunction of cervical region: Secondary | ICD-10-CM | POA: Diagnosis not present

## 2016-01-13 DIAGNOSIS — M9902 Segmental and somatic dysfunction of thoracic region: Secondary | ICD-10-CM | POA: Diagnosis not present

## 2016-01-13 DIAGNOSIS — G44329 Chronic post-traumatic headache, not intractable: Secondary | ICD-10-CM | POA: Diagnosis not present

## 2016-01-13 DIAGNOSIS — M546 Pain in thoracic spine: Secondary | ICD-10-CM | POA: Diagnosis not present

## 2016-01-16 DIAGNOSIS — M9901 Segmental and somatic dysfunction of cervical region: Secondary | ICD-10-CM | POA: Diagnosis not present

## 2016-01-16 DIAGNOSIS — M546 Pain in thoracic spine: Secondary | ICD-10-CM | POA: Diagnosis not present

## 2016-01-16 DIAGNOSIS — G44329 Chronic post-traumatic headache, not intractable: Secondary | ICD-10-CM | POA: Diagnosis not present

## 2016-01-16 DIAGNOSIS — M9902 Segmental and somatic dysfunction of thoracic region: Secondary | ICD-10-CM | POA: Diagnosis not present

## 2016-01-18 DIAGNOSIS — G44329 Chronic post-traumatic headache, not intractable: Secondary | ICD-10-CM | POA: Diagnosis not present

## 2016-01-18 DIAGNOSIS — M546 Pain in thoracic spine: Secondary | ICD-10-CM | POA: Diagnosis not present

## 2016-01-18 DIAGNOSIS — M9901 Segmental and somatic dysfunction of cervical region: Secondary | ICD-10-CM | POA: Diagnosis not present

## 2016-01-18 DIAGNOSIS — M9902 Segmental and somatic dysfunction of thoracic region: Secondary | ICD-10-CM | POA: Diagnosis not present

## 2016-01-20 DIAGNOSIS — M546 Pain in thoracic spine: Secondary | ICD-10-CM | POA: Diagnosis not present

## 2016-01-20 DIAGNOSIS — M9901 Segmental and somatic dysfunction of cervical region: Secondary | ICD-10-CM | POA: Diagnosis not present

## 2016-01-20 DIAGNOSIS — G44329 Chronic post-traumatic headache, not intractable: Secondary | ICD-10-CM | POA: Diagnosis not present

## 2016-01-20 DIAGNOSIS — M9902 Segmental and somatic dysfunction of thoracic region: Secondary | ICD-10-CM | POA: Diagnosis not present

## 2016-01-23 DIAGNOSIS — M9901 Segmental and somatic dysfunction of cervical region: Secondary | ICD-10-CM | POA: Diagnosis not present

## 2016-01-23 DIAGNOSIS — M546 Pain in thoracic spine: Secondary | ICD-10-CM | POA: Diagnosis not present

## 2016-01-23 DIAGNOSIS — G44329 Chronic post-traumatic headache, not intractable: Secondary | ICD-10-CM | POA: Diagnosis not present

## 2016-01-23 DIAGNOSIS — M9902 Segmental and somatic dysfunction of thoracic region: Secondary | ICD-10-CM | POA: Diagnosis not present

## 2016-01-24 DIAGNOSIS — H40053 Ocular hypertension, bilateral: Secondary | ICD-10-CM | POA: Diagnosis not present

## 2016-01-25 DIAGNOSIS — M9901 Segmental and somatic dysfunction of cervical region: Secondary | ICD-10-CM | POA: Diagnosis not present

## 2016-01-25 DIAGNOSIS — M546 Pain in thoracic spine: Secondary | ICD-10-CM | POA: Diagnosis not present

## 2016-01-25 DIAGNOSIS — M9902 Segmental and somatic dysfunction of thoracic region: Secondary | ICD-10-CM | POA: Diagnosis not present

## 2016-01-25 DIAGNOSIS — G44329 Chronic post-traumatic headache, not intractable: Secondary | ICD-10-CM | POA: Diagnosis not present

## 2016-01-27 DIAGNOSIS — M546 Pain in thoracic spine: Secondary | ICD-10-CM | POA: Diagnosis not present

## 2016-01-27 DIAGNOSIS — M9902 Segmental and somatic dysfunction of thoracic region: Secondary | ICD-10-CM | POA: Diagnosis not present

## 2016-01-27 DIAGNOSIS — M9901 Segmental and somatic dysfunction of cervical region: Secondary | ICD-10-CM | POA: Diagnosis not present

## 2016-01-27 DIAGNOSIS — G44329 Chronic post-traumatic headache, not intractable: Secondary | ICD-10-CM | POA: Diagnosis not present

## 2016-02-01 DIAGNOSIS — M546 Pain in thoracic spine: Secondary | ICD-10-CM | POA: Diagnosis not present

## 2016-02-01 DIAGNOSIS — M9901 Segmental and somatic dysfunction of cervical region: Secondary | ICD-10-CM | POA: Diagnosis not present

## 2016-02-01 DIAGNOSIS — G44329 Chronic post-traumatic headache, not intractable: Secondary | ICD-10-CM | POA: Diagnosis not present

## 2016-02-01 DIAGNOSIS — M9902 Segmental and somatic dysfunction of thoracic region: Secondary | ICD-10-CM | POA: Diagnosis not present

## 2016-02-07 DIAGNOSIS — M9901 Segmental and somatic dysfunction of cervical region: Secondary | ICD-10-CM | POA: Diagnosis not present

## 2016-02-07 DIAGNOSIS — M9902 Segmental and somatic dysfunction of thoracic region: Secondary | ICD-10-CM | POA: Diagnosis not present

## 2016-02-07 DIAGNOSIS — G44329 Chronic post-traumatic headache, not intractable: Secondary | ICD-10-CM | POA: Diagnosis not present

## 2016-02-07 DIAGNOSIS — M546 Pain in thoracic spine: Secondary | ICD-10-CM | POA: Diagnosis not present

## 2016-02-16 DIAGNOSIS — M9901 Segmental and somatic dysfunction of cervical region: Secondary | ICD-10-CM | POA: Diagnosis not present

## 2016-02-16 DIAGNOSIS — G44329 Chronic post-traumatic headache, not intractable: Secondary | ICD-10-CM | POA: Diagnosis not present

## 2016-02-16 DIAGNOSIS — M9902 Segmental and somatic dysfunction of thoracic region: Secondary | ICD-10-CM | POA: Diagnosis not present

## 2016-02-16 DIAGNOSIS — M546 Pain in thoracic spine: Secondary | ICD-10-CM | POA: Diagnosis not present

## 2016-02-21 DIAGNOSIS — M9902 Segmental and somatic dysfunction of thoracic region: Secondary | ICD-10-CM | POA: Diagnosis not present

## 2016-02-21 DIAGNOSIS — M546 Pain in thoracic spine: Secondary | ICD-10-CM | POA: Diagnosis not present

## 2016-02-21 DIAGNOSIS — G44329 Chronic post-traumatic headache, not intractable: Secondary | ICD-10-CM | POA: Diagnosis not present

## 2016-02-21 DIAGNOSIS — M9901 Segmental and somatic dysfunction of cervical region: Secondary | ICD-10-CM | POA: Diagnosis not present

## 2016-02-28 DIAGNOSIS — M9901 Segmental and somatic dysfunction of cervical region: Secondary | ICD-10-CM | POA: Diagnosis not present

## 2016-02-28 DIAGNOSIS — M9902 Segmental and somatic dysfunction of thoracic region: Secondary | ICD-10-CM | POA: Diagnosis not present

## 2016-02-28 DIAGNOSIS — G44329 Chronic post-traumatic headache, not intractable: Secondary | ICD-10-CM | POA: Diagnosis not present

## 2016-02-28 DIAGNOSIS — M546 Pain in thoracic spine: Secondary | ICD-10-CM | POA: Diagnosis not present

## 2016-03-06 DIAGNOSIS — G44329 Chronic post-traumatic headache, not intractable: Secondary | ICD-10-CM | POA: Diagnosis not present

## 2016-03-06 DIAGNOSIS — M9902 Segmental and somatic dysfunction of thoracic region: Secondary | ICD-10-CM | POA: Diagnosis not present

## 2016-03-06 DIAGNOSIS — M546 Pain in thoracic spine: Secondary | ICD-10-CM | POA: Diagnosis not present

## 2016-03-06 DIAGNOSIS — M9901 Segmental and somatic dysfunction of cervical region: Secondary | ICD-10-CM | POA: Diagnosis not present

## 2016-03-20 DIAGNOSIS — M546 Pain in thoracic spine: Secondary | ICD-10-CM | POA: Diagnosis not present

## 2016-03-20 DIAGNOSIS — G44329 Chronic post-traumatic headache, not intractable: Secondary | ICD-10-CM | POA: Diagnosis not present

## 2016-03-20 DIAGNOSIS — M9902 Segmental and somatic dysfunction of thoracic region: Secondary | ICD-10-CM | POA: Diagnosis not present

## 2016-03-20 DIAGNOSIS — M9901 Segmental and somatic dysfunction of cervical region: Secondary | ICD-10-CM | POA: Diagnosis not present

## 2016-03-27 DIAGNOSIS — G44329 Chronic post-traumatic headache, not intractable: Secondary | ICD-10-CM | POA: Diagnosis not present

## 2016-03-27 DIAGNOSIS — M9902 Segmental and somatic dysfunction of thoracic region: Secondary | ICD-10-CM | POA: Diagnosis not present

## 2016-03-27 DIAGNOSIS — M9901 Segmental and somatic dysfunction of cervical region: Secondary | ICD-10-CM | POA: Diagnosis not present

## 2016-03-27 DIAGNOSIS — M546 Pain in thoracic spine: Secondary | ICD-10-CM | POA: Diagnosis not present

## 2016-04-03 DIAGNOSIS — M9901 Segmental and somatic dysfunction of cervical region: Secondary | ICD-10-CM | POA: Diagnosis not present

## 2016-04-03 DIAGNOSIS — M9902 Segmental and somatic dysfunction of thoracic region: Secondary | ICD-10-CM | POA: Diagnosis not present

## 2016-04-03 DIAGNOSIS — G44329 Chronic post-traumatic headache, not intractable: Secondary | ICD-10-CM | POA: Diagnosis not present

## 2016-04-03 DIAGNOSIS — M546 Pain in thoracic spine: Secondary | ICD-10-CM | POA: Diagnosis not present

## 2016-04-06 ENCOUNTER — Encounter: Payer: Self-pay | Admitting: Internal Medicine

## 2016-04-06 ENCOUNTER — Ambulatory Visit (INDEPENDENT_AMBULATORY_CARE_PROVIDER_SITE_OTHER): Payer: PPO | Admitting: Internal Medicine

## 2016-04-06 VITALS — BP 118/70 | HR 61 | Ht 62.0 in | Wt 257.4 lb

## 2016-04-06 DIAGNOSIS — A319 Mycobacterial infection, unspecified: Secondary | ICD-10-CM

## 2016-04-06 DIAGNOSIS — R7611 Nonspecific reaction to tuberculin skin test without active tuberculosis: Secondary | ICD-10-CM | POA: Diagnosis not present

## 2016-04-06 NOTE — Patient Instructions (Signed)
I am glad you are doing so well   We will be happy to see you again if you need [place]

## 2016-04-06 NOTE — Progress Notes (Signed)
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 Because of persistent cough this winter chest x-ray was done showing mild central bronchitis with a 7 mm granuloma in the right midlung zone. 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 She took 2 months of INH but when sputum culture returned showing atypical AFB, treatment was changed to erythromycin. On erythromycin, usually only tolerating two of 3 prescribed tablets daily because of persistent diarrhea. Bronchitis symptoms have now completely cleared with no cough or sputum. She denies night sweats unless she misses her hormone supplement. Weight has increased. No adenopathy. 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 Recent liver functions normal. She says promptly after starting INH a small skin lesion on her right upper anterior chest, and a nodule on her thumb both resolved. She has no known history of TB contact in self or family. CXR 09/24/14 FINDINGS: Cardiomediastinal silhouette is unremarkable. No acute infiltrate or pleural effusion. No pulmonary edema. Minimal central bronchitic changes. Calcified granuloma in right midlung laterally measures 7 mm. Mild degenerative changes mid and lower thoracic spine. IMPRESSION: No acute infiltrate or pulmonary edema. Minimal central bronchitic changes. Electronically Signed  By: Lahoma Crocker M.D.  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 No bad cold since last Fall. Few distinct hot flashes with sweats a few weeks ago. No ongoing night sweat, nodes or discolored sputum. CXR 02/04/15 IMPRESSION: 1. Stable  small peripheral right mid lung field nodule. No change from prior exam. Stable left base subsegmental atelectasis and/or scarring. 2. No acute cardiopulmonary disease. 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 Cough semi-productive with milky phlegm. Pt refused flu vaccine  Continues to work with Dr. Sharol Roussel. Doing much better. Little cough now with scant white phlegm. No acute events. Feels well. Typically in November/December she'll get a bad chest cold which lingers but that has not happened so far.  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 Sharol Roussel) FOLLOWS FOR: Pt denies any problems wtih breathing at all. She has no significant cough, night sweats or chest discomfort. Last chest x-ray just shown an old granuloma  ROS-see HPI   Negative unless "+" Constitutional:    weight loss, night sweats, fevers, chills, fatigue, lassitude. HEENT:    headaches, difficulty swallowing, tooth/dental problems, sore throat,       sneezing, itching, ear ache, nasal congestion, post nasal drip, snoring CV:    chest pain, orthopnea, PND, swelling in lower extremities, anasarca,  dizziness, palpitations Resp:   shortness of breath with exertion or at rest.                productive cough,   non-productive cough, coughing up of blood.              change in color of mucus.  wheezing.   Skin:    rash or lesions. GI:  No-   heartburn, indigestion, abdominal pain, nausea, vomiting,  GU:  MS:   joint pain, stiffness,  Neuro-     nothing  unusual Psych:  change in mood or affect.  depression or anxiety.   memory loss.  OBJ- Physical Exam General- Alert, Oriented, Affect-appropriate, Distress- none acute,+overweight, looks well Skin- rash-none, lesions- none, excoriation- none Lymphadenopathy- none Head- atraumatic            Eyes- Gross vision intact, PERRLA, conjunctivae and secretions clear            Ears- Hearing, canals-normal            Nose- Clear, no-Septal dev, mucus, polyps, erosion, perforation             Throat- Mallampati II , mucosa clear , drainage- none, tonsils- atrophic Neck- flexible , trachea midline, no stridor , thyroid nl, carotid no bruit Chest - symmetrical excursion , unlabored           Heart/CV- RRR , no murmur , no gallop  , no rub, nl s1 s2                           - JVD- none , edema- none, stasis changes- none, varices- none           Lung- clear to P&A, wheeze- none, cough- none , dullness-none, rub- none           Chest wall-  Abd-  Br/ Gen/ Rectal- Not done, not indicated Extrem- cyanosis- none, clubbing, none, atrophy- none, strength- nl Neuro- grossly intact to observation

## 2016-04-06 NOTE — Assessment & Plan Note (Signed)
There is no sign of active granulomatous disease. No significant follow-up required.

## 2016-04-06 NOTE — Assessment & Plan Note (Signed)
No evidence for active granulomatous disease.

## 2016-04-17 DIAGNOSIS — E641 Sequelae of vitamin A deficiency: Secondary | ICD-10-CM | POA: Diagnosis not present

## 2016-04-17 DIAGNOSIS — I10 Essential (primary) hypertension: Secondary | ICD-10-CM | POA: Diagnosis not present

## 2016-04-17 DIAGNOSIS — M9902 Segmental and somatic dysfunction of thoracic region: Secondary | ICD-10-CM | POA: Diagnosis not present

## 2016-04-17 DIAGNOSIS — E559 Vitamin D deficiency, unspecified: Secondary | ICD-10-CM | POA: Diagnosis not present

## 2016-04-17 DIAGNOSIS — M9901 Segmental and somatic dysfunction of cervical region: Secondary | ICD-10-CM | POA: Diagnosis not present

## 2016-04-17 DIAGNOSIS — E065 Other chronic thyroiditis: Secondary | ICD-10-CM | POA: Diagnosis not present

## 2016-04-17 DIAGNOSIS — M546 Pain in thoracic spine: Secondary | ICD-10-CM | POA: Diagnosis not present

## 2016-04-17 DIAGNOSIS — E279 Disorder of adrenal gland, unspecified: Secondary | ICD-10-CM | POA: Diagnosis not present

## 2016-04-17 DIAGNOSIS — G44329 Chronic post-traumatic headache, not intractable: Secondary | ICD-10-CM | POA: Diagnosis not present

## 2016-04-17 DIAGNOSIS — N951 Menopausal and female climacteric states: Secondary | ICD-10-CM | POA: Diagnosis not present

## 2016-04-17 DIAGNOSIS — E7800 Pure hypercholesterolemia, unspecified: Secondary | ICD-10-CM | POA: Diagnosis not present

## 2016-04-17 DIAGNOSIS — E7210 Disorders of sulfur-bearing amino-acid metabolism, unspecified: Secondary | ICD-10-CM | POA: Diagnosis not present

## 2016-05-01 DIAGNOSIS — M546 Pain in thoracic spine: Secondary | ICD-10-CM | POA: Diagnosis not present

## 2016-05-01 DIAGNOSIS — G44329 Chronic post-traumatic headache, not intractable: Secondary | ICD-10-CM | POA: Diagnosis not present

## 2016-05-01 DIAGNOSIS — M9901 Segmental and somatic dysfunction of cervical region: Secondary | ICD-10-CM | POA: Diagnosis not present

## 2016-05-01 DIAGNOSIS — M9902 Segmental and somatic dysfunction of thoracic region: Secondary | ICD-10-CM | POA: Diagnosis not present

## 2016-05-14 DIAGNOSIS — M9901 Segmental and somatic dysfunction of cervical region: Secondary | ICD-10-CM | POA: Diagnosis not present

## 2016-05-14 DIAGNOSIS — E7210 Disorders of sulfur-bearing amino-acid metabolism, unspecified: Secondary | ICD-10-CM | POA: Diagnosis not present

## 2016-05-14 DIAGNOSIS — M546 Pain in thoracic spine: Secondary | ICD-10-CM | POA: Diagnosis not present

## 2016-05-14 DIAGNOSIS — M109 Gout, unspecified: Secondary | ICD-10-CM | POA: Diagnosis not present

## 2016-05-14 DIAGNOSIS — G44329 Chronic post-traumatic headache, not intractable: Secondary | ICD-10-CM | POA: Diagnosis not present

## 2016-05-14 DIAGNOSIS — M9902 Segmental and somatic dysfunction of thoracic region: Secondary | ICD-10-CM | POA: Diagnosis not present

## 2016-05-29 DIAGNOSIS — G44329 Chronic post-traumatic headache, not intractable: Secondary | ICD-10-CM | POA: Diagnosis not present

## 2016-05-29 DIAGNOSIS — M9901 Segmental and somatic dysfunction of cervical region: Secondary | ICD-10-CM | POA: Diagnosis not present

## 2016-05-29 DIAGNOSIS — M9902 Segmental and somatic dysfunction of thoracic region: Secondary | ICD-10-CM | POA: Diagnosis not present

## 2016-05-29 DIAGNOSIS — M546 Pain in thoracic spine: Secondary | ICD-10-CM | POA: Diagnosis not present

## 2016-06-12 DIAGNOSIS — G44329 Chronic post-traumatic headache, not intractable: Secondary | ICD-10-CM | POA: Diagnosis not present

## 2016-06-12 DIAGNOSIS — M9902 Segmental and somatic dysfunction of thoracic region: Secondary | ICD-10-CM | POA: Diagnosis not present

## 2016-06-12 DIAGNOSIS — M546 Pain in thoracic spine: Secondary | ICD-10-CM | POA: Diagnosis not present

## 2016-06-12 DIAGNOSIS — M9901 Segmental and somatic dysfunction of cervical region: Secondary | ICD-10-CM | POA: Diagnosis not present

## 2016-07-26 DIAGNOSIS — H2513 Age-related nuclear cataract, bilateral: Secondary | ICD-10-CM | POA: Diagnosis not present

## 2016-07-26 DIAGNOSIS — H40053 Ocular hypertension, bilateral: Secondary | ICD-10-CM | POA: Diagnosis not present

## 2016-08-22 DIAGNOSIS — D485 Neoplasm of uncertain behavior of skin: Secondary | ICD-10-CM | POA: Diagnosis not present

## 2016-08-27 DIAGNOSIS — L821 Other seborrheic keratosis: Secondary | ICD-10-CM | POA: Diagnosis not present

## 2016-08-27 DIAGNOSIS — L814 Other melanin hyperpigmentation: Secondary | ICD-10-CM | POA: Diagnosis not present

## 2016-08-27 DIAGNOSIS — D239 Other benign neoplasm of skin, unspecified: Secondary | ICD-10-CM | POA: Diagnosis not present

## 2016-09-14 DIAGNOSIS — E065 Other chronic thyroiditis: Secondary | ICD-10-CM | POA: Diagnosis not present

## 2016-09-14 DIAGNOSIS — E7210 Disorders of sulfur-bearing amino-acid metabolism, unspecified: Secondary | ICD-10-CM | POA: Diagnosis not present

## 2016-09-14 DIAGNOSIS — I10 Essential (primary) hypertension: Secondary | ICD-10-CM | POA: Diagnosis not present

## 2016-09-14 DIAGNOSIS — E785 Hyperlipidemia, unspecified: Secondary | ICD-10-CM | POA: Diagnosis not present

## 2016-09-14 DIAGNOSIS — E559 Vitamin D deficiency, unspecified: Secondary | ICD-10-CM | POA: Diagnosis not present

## 2016-10-04 DIAGNOSIS — E039 Hypothyroidism, unspecified: Secondary | ICD-10-CM | POA: Diagnosis not present

## 2016-10-09 ENCOUNTER — Emergency Department (HOSPITAL_BASED_OUTPATIENT_CLINIC_OR_DEPARTMENT_OTHER): Payer: PPO

## 2016-10-09 ENCOUNTER — Emergency Department (HOSPITAL_BASED_OUTPATIENT_CLINIC_OR_DEPARTMENT_OTHER)
Admission: EM | Admit: 2016-10-09 | Discharge: 2016-10-09 | Disposition: A | Discharge status: Home / Self Care | Payer: PPO | Attending: Emergency Medicine | Admitting: Emergency Medicine

## 2016-10-09 ENCOUNTER — Encounter (HOSPITAL_BASED_OUTPATIENT_CLINIC_OR_DEPARTMENT_OTHER): Payer: Self-pay | Admitting: *Deleted

## 2016-10-09 DIAGNOSIS — Y999 Unspecified external cause status: Secondary | ICD-10-CM | POA: Insufficient documentation

## 2016-10-09 DIAGNOSIS — Y929 Unspecified place or not applicable: Secondary | ICD-10-CM | POA: Diagnosis not present

## 2016-10-09 DIAGNOSIS — M79671 Pain in right foot: Secondary | ICD-10-CM

## 2016-10-09 DIAGNOSIS — I10 Essential (primary) hypertension: Secondary | ICD-10-CM | POA: Insufficient documentation

## 2016-10-09 DIAGNOSIS — Z7982 Long term (current) use of aspirin: Secondary | ICD-10-CM | POA: Diagnosis not present

## 2016-10-09 DIAGNOSIS — W010XXA Fall on same level from slipping, tripping and stumbling without subsequent striking against object, initial encounter: Secondary | ICD-10-CM | POA: Diagnosis not present

## 2016-10-09 DIAGNOSIS — Y9301 Activity, walking, marching and hiking: Secondary | ICD-10-CM | POA: Insufficient documentation

## 2016-10-09 DIAGNOSIS — M25571 Pain in right ankle and joints of right foot: Secondary | ICD-10-CM | POA: Insufficient documentation

## 2016-10-09 DIAGNOSIS — M7989 Other specified soft tissue disorders: Secondary | ICD-10-CM | POA: Diagnosis not present

## 2016-10-09 DIAGNOSIS — S99911A Unspecified injury of right ankle, initial encounter: Secondary | ICD-10-CM | POA: Diagnosis not present

## 2016-10-09 MED ORDER — IBUPROFEN 600 MG PO TABS: 600.0000 mg | ORAL_TABLET | Freq: Three times a day (TID) | ORAL | 0 refills | Status: DC | PRN

## 2016-10-09 MED FILL — IBUPROFEN 600 MG TABLET: 600 | 5 days supply | Qty: 15 | Fill #0

## 2016-10-09 NOTE — Discharge Instructions (Signed)
Read the information below.  Use the prescribed medication as directed.  Please discuss all new medications with your pharmacist.  You may return to the Emergency Department at any time for worsening condition or any new symptoms that concern you.   If you develop uncontrolled pain, weakness or numbness of the extremity, severe discoloration of the skin, or you are unable to walk or move your foot, return to the ER for a recheck.

## 2016-10-09 NOTE — ED Triage Notes (Signed)
She tripped and fell last night. Right leg pain.

## 2016-10-09 NOTE — ED Notes (Signed)
ED Provider at bedside. 

## 2016-10-09 NOTE — ED Notes (Signed)
Pt directed to pharmacy to pick up RX 

## 2016-10-09 NOTE — ED Provider Notes (Signed)
[facility] Provider Note   CSN: [id number] Arrival date & time: 10/09/16  1209     History   Chief Complaint Chief Complaint  Patient presents with  . Fall    HPI Wendy Lane is a 65 y.o. female.  HPI   Patient presents with right ankle and great toe pain that began after she tripped while walking on gravel and fell onto her right side.  Has cuts and scrapes over her lower leg and right elbow but denies any other significant injury.  Declines Tetanus vaccination.    Past Medical History:  Diagnosis Date  . Allergy   . Hypertension     Patient Active Problem List   Diagnosis Date Noted  . Mycobacterium infection, atypical 02/04/2015  . PPD positive 02/04/2015  . Porphyria cutanea tarda ([place]) 02/04/2015    Past Surgical History:  Procedure Laterality Date  . BREAST LUMPECTOMY  1970's  . CHOLECYSTECTOMY  1987  . TUBAL LIGATION  1977    OB History    No data available       Home Medications    Prior to Admission medications   Medication Sig Start Date End Date Taking? Authorizing Provider  aspirin 81 MG tablet Take 81 mg by mouth daily.    Historical Provider, MD  cholecalciferol (VITAMIN D) 1000 UNITS tablet Take 1,000 Units by mouth daily.    Historical Provider, MD  cyanocobalamin (,VITAMIN B-12,) 1000 MCG/ML injection Injection weekly 01/11/15   Historical Provider, MD  Docosahexaenoic Acid (DHA PO) Take 10 mg by mouth daily.    Historical Provider, MD  estradiol (VIVELLE-DOT) 0.05 MG/24HR patch Place 1 patch onto the skin 2 (two) times a week.    Historical Provider, MD  hydrochlorothiazide (HYDRODIURIL) 12.5 MG tablet Take 12.5 mg by mouth daily.    Historical Provider, MD  ibuprofen (ADVIL,MOTRIN) 600 MG tablet Take 1 tablet (600 mg total) by mouth every 8 (eight) hours as needed for mild pain or moderate pain. 10/09/16   Clayton Bibles, PA-C  levothyroxine (SYNTHROID, LEVOTHROID) 112 MCG tablet Take 1 tablet by mouth daily. 11/15/14    Historical Provider, MD  losartan (COZAAR) 100 MG tablet 100 mg daily. 08/18/15   Historical Provider, MD  magnesium gluconate (MAGONATE) 500 MG tablet Take 500 mg by mouth 2 (two) times daily.    Historical Provider, MD  Melatonin 1 MG TABS Take 1 mg by mouth at bedtime.    Historical Provider, MD  Omega-3 Fatty Acids (OMEGA 3 PO) Take 1,200 mg by mouth daily.    Historical Provider, MD  orphenadrine (NORFLEX) 100 MG tablet Take 1 tablet (100 mg total) by mouth 2 (two) times daily. 11/08/15   Charlesetta Shanks, MD  progesterone 200 MG SUPP Place 200 mg vaginally at bedtime.    Historical Provider, MD  vitamin A 10000 UNIT capsule Take 10,000 Units by mouth daily.    Historical Provider, MD    Family History Family History  Problem Relation Age of Onset  . Lung cancer Father     smoker  . Allergies      mother and father side of family  . Diabetes Brother     Social History Social History  Substance Use Topics  . Smoking status: Never Smoker  . Smokeless tobacco: Never Used  . Alcohol use 0.0 oz/week     Comment: social(maybe 2 a year)     Allergies   Sulfa antibiotics   Review of Systems Review of Systems  Constitutional:  Negative for activity change, appetite change, diaphoresis and fever.  Cardiovascular: Negative for chest pain.  Gastrointestinal: Negative for abdominal pain.  Musculoskeletal: Positive for arthralgias and gait problem.  Skin: Positive for wound.  Allergic/Immunologic: Negative for immunocompromised state.  Neurological: Negative for weakness and numbness.  Hematological: Does not bruise/bleed easily.  Psychiatric/Behavioral: Negative for self-injury.     Physical Exam Updated Vital Signs BP 117/70   Pulse 67   Temp 98.8 F (37.1 C) (Oral)   Resp 16   Ht 5\' 2"  (1.575 m)   Wt 116.6 kg   SpO2 97%   BMI 47.01 kg/m   Physical Exam  Constitutional: She appears well-developed and well-nourished. No distress.  HENT:  Head: Normocephalic and  atraumatic.  Neck: Neck supple.  Pulmonary/Chest: Effort normal.  Musculoskeletal:       Feet:  Neurological: She is alert.  Skin: She is not diaphoretic.  Nursing note and vitals reviewed.    ED Treatments / Results  Labs (all labs ordered are listed, but only abnormal results are displayed) Labs Reviewed - No data to display  EKG  EKG Interpretation None       Radiology Dg Ankle Complete Right  Result Date: 10/09/2016 CLINICAL DATA:  Tripped and fell last night appearing pain at the medial malleolus. EXAM: RIGHT ANKLE - COMPLETE 3+ VIEW COMPARISON:  Right ankle radiographs 12/21/2008. FINDINGS: Soft tissue swelling is present at the medial malleolus. There is periosteal reaction at the medial malleolus which was not present in 2010. No acute fracture is present. The ankle joint is located. Plantar calcaneal spurs are noted. IMPRESSION: 1. Soft tissue swelling over the media malleable is without acute fracture. 2. Periosteal reaction at the medial malleolus. This may be related to recent, but not acute, trauma. Recommend follow-up ankle radiographs in 2-3 months to assure resolution. Electronically Signed   By: San Morelle M.D.   On: 10/09/2016 12:48   Dg Foot Complete Right  Result Date: 10/09/2016 CLINICAL DATA:  Tripped and fell last night. Pain at the first MTP joint. EXAM: RIGHT FOOT COMPLETE - 3+ VIEW COMPARISON:  Ankle radiographs 12/21/2008 FINDINGS: Soft tissue swelling is present over the dorsum and medial aspect of the foot. There is no underlying fracture. Mild degenerate changes are present in the midfoot and at the first MTP joint. Calcaneal spurs are noted. IMPRESSION: Soft tissue swelling over the medial and dorsal aspect of the foot without underlying fracture. Degenerative changes are noted. Electronically Signed   By: San Morelle M.D.   On: 10/09/2016 12:45    Procedures Procedures (including critical care time)  Medications Ordered in  ED Medications - No data to display   Initial Impression / Assessment and Plan / ED Course  I have reviewed the triage vital signs and the nursing notes.  Pertinent labs & imaging results that were available during my care of the patient were reviewed by me and considered in my medical decision making (see chart for details).  Clinical Course     Afebrile, nontoxic patient with injury to her right ankle and foot while tripping on gravel.   Xray demonstrates no acute change, pt made aware of chronic change and need for repeat film.  Engaged in joint medical decision making and pt declines any other xrays or workup of fall, declines tdap update.  Placed in ASO and postop shoe.  Declines crutches.   D/C home with PCP follow up.  Discussed result, findings, treatment, and follow up  with patient.  Pt given return precautions.  Pt verbalizes understanding and agrees with plan.      Final Clinical Impressions(s) / ED Diagnoses   Final diagnoses:  Acute right ankle pain  Right foot pain    New Prescriptions Discharge Medication List as of 10/09/2016  3:15 PM    START taking these medications   Details  ibuprofen (ADVIL,MOTRIN) 600 MG tablet Take 1 tablet (600 mg total) by mouth every 8 (eight) hours as needed for mild pain or moderate pain., Starting Tue 10/09/2016, Print         [place], PA-C 10/09/16 1705    Charlesetta Shanks, MD 10/17/16 [phone]

## 2017-01-21 DIAGNOSIS — Z1231 Encounter for screening mammogram for malignant neoplasm of breast: Secondary | ICD-10-CM | POA: Diagnosis not present

## 2017-02-05 DIAGNOSIS — N760 Acute vaginitis: Secondary | ICD-10-CM | POA: Diagnosis not present

## 2017-03-12 DIAGNOSIS — Z6841 Body Mass Index (BMI) 40.0 and over, adult: Secondary | ICD-10-CM | POA: Diagnosis not present

## 2017-03-12 DIAGNOSIS — Z01419 Encounter for gynecological examination (general) (routine) without abnormal findings: Secondary | ICD-10-CM | POA: Diagnosis not present

## 2017-03-14 ENCOUNTER — Ambulatory Visit (INDEPENDENT_AMBULATORY_CARE_PROVIDER_SITE_OTHER): Payer: PPO | Admitting: Allergy and Immunology

## 2017-03-14 ENCOUNTER — Encounter: Payer: Self-pay | Admitting: Allergy and Immunology

## 2017-03-14 VITALS — BP 136/76 | HR 77 | Temp 98.0°F | Resp 16 | Ht 63.25 in | Wt 256.0 lb

## 2017-03-14 DIAGNOSIS — Z91018 Allergy to other foods: Secondary | ICD-10-CM | POA: Diagnosis not present

## 2017-03-14 DIAGNOSIS — J3089 Other allergic rhinitis: Secondary | ICD-10-CM | POA: Insufficient documentation

## 2017-03-14 DIAGNOSIS — H6983 Other specified disorders of Eustachian tube, bilateral: Secondary | ICD-10-CM

## 2017-03-14 DIAGNOSIS — H6980 Other specified disorders of Eustachian tube, unspecified ear: Secondary | ICD-10-CM | POA: Insufficient documentation

## 2017-03-14 MED ORDER — AZELASTINE HCL 0.1 % NA SOLN: 1.0000 | Freq: Two times a day (BID) | NASAL | 5 refills | Status: DC | PRN

## 2017-03-14 MED ORDER — FLUTICASONE PROPIONATE 50 MCG/ACT NA SUSP: 2.0000 | Freq: Every day | NASAL | 5 refills | Status: DC

## 2017-03-14 NOTE — Assessment & Plan Note (Addendum)
   Aeroallergen avoidance measures have been discussed and provided in written form.  A prescription has been provided for azelastine nasal spray, 1-2 sprays per nostril 2 times daily as needed. Proper nasal spray technique has been discussed and demonstrated.   If needed, add fluticasone nasal spray, 2 sprays per nostril daily.  A prescription has been provided.  I have also recommended nasal saline spray (i.e., Simply Saline) or nasal saline lavage (i.e., NeilMed) as needed and prior to medicated nasal sprays.  For thick post nasal drainage, add guaifenesin [phone] mg (Mucinex)  twice daily as needed with adequate hydration as discussed.  If allergen avoidance measures and medications fail to adequately relieve symptoms, aeroallergen immunotherapy will be considered.

## 2017-03-14 NOTE — Assessment & Plan Note (Deleted)
Gastrointestinal symptoms, unclear etiology. Skin tests to select food allergens were negative today. The negative predictive value of food allergen skin testing is excellent (approximately 95%). While this does not appear to be an IgE mediated issue, skin testing does not rule out food intolerances or cell-mediated enteropathies which may lend to GI symptoms. These etiologies are suggested when elimination of the responsible food leads to symptom resolution and re-introduction of the food is followed by the return of symptoms.   The patient has been encouraged to keep a careful symptom/food journal and eliminate any food suspected of correlating with symptoms.  For now, continue avoidance of products containing cow's milk, gluten, and egg.  If GI symptoms persist or progress, gastroenterologist evaluation may be warranted.

## 2017-03-14 NOTE — Patient Instructions (Addendum)
Perennial and seasonal allergic rhinitis  Aeroallergen avoidance measures have been discussed and provided in written form.  A prescription has been provided for azelastine nasal spray, 1-2 sprays per nostril 2 times daily as needed. Proper nasal spray technique has been discussed and demonstrated.   If needed, add fluticasone nasal spray, 2 sprays per nostril daily.  A prescription has been provided.  I have also recommended nasal saline spray (i.e., Simply Saline) or nasal saline lavage (i.e., NeilMed) as needed and prior to medicated nasal sprays.  For thick post nasal drainage, add guaifenesin [phone] mg (Mucinex)  twice daily as needed with adequate hydration as discussed.  If allergen avoidance measures and medications fail to adequately relieve symptoms, aeroallergen immunotherapy will be considered.  Eustachian tube dysfunction  Azelastine nasal spray, fluticasone nasal spray, and nasal saline irrigation (as above).  With this treatment plan, see if you are able to discontinue hydrochlorothiazide.  History of food allergy Gastrointestinal symptoms, unclear etiology. Skin tests to select food allergens were negative today. The negative predictive value of food allergen skin testing is excellent (approximately 95%). While this does not appear to be an IgE mediated issue, skin testing does not rule out food intolerances or cell-mediated enteropathies which may lend to GI symptoms. These etiologies are suggested when elimination of the responsible food leads to symptom resolution and re-introduction of the food is followed by the return of symptoms.   The patient has been encouraged to keep a careful symptom/food journal and eliminate any food suspected of correlating with symptoms.  For now, continue avoidance of products containing cow's milk, gluten, and egg.  If GI symptoms persist or progress, gastroenterologist evaluation may be warranted.   Return in about 3 months (around  06/14/2017), or if symptoms worsen or fail to improve.  Reducing Pollen Exposure  The American Academy of Allergy, Asthma and Immunology suggests the following steps to reduce your exposure to pollen during allergy seasons.    1. Do not hang sheets or clothing out to dry; pollen may collect on these items. 2. Do not mow lawns or spend time around freshly cut grass; mowing stirs up pollen. 3. Keep windows closed at night.  Keep car windows closed while driving. 4. Minimize morning activities outdoors, a time when pollen counts are usually at their highest. 5. Stay indoors as much as possible when pollen counts or humidity is high and on windy days when pollen tends to remain in the air longer. 6. Use air conditioning when possible.  Many air conditioners have filters that trap the pollen spores. 7. Use a HEPA room air filter to remove pollen form the indoor air you breathe.   Control of House Dust Mite Allergen  House dust mites play a major role in allergic asthma and rhinitis.  They occur in environments with high humidity wherever human skin, the food for dust mites is found. High levels have been detected in dust obtained from mattresses, pillows, carpets, upholstered furniture, bed covers, clothes and soft toys.  The principal allergen of the house dust mite is found in its feces.  A gram of dust may contain 1,000 mites and 250,000 fecal particles.  Mite antigen is easily measured in the air during house cleaning activities.    1. Encase mattresses, including the box spring, and pillow, in an air tight cover.  Seal the zipper end of the encased mattresses with wide adhesive tape. 2. Wash the bedding in water of 130 degrees Farenheit weekly.  Avoid cotton comforters/quilts  and flannel bedding: the most ideal bed covering is the dacron comforter. 3. Remove all upholstered furniture from the bedroom. 4. Remove carpets, carpet padding, rugs, and non-washable window drapes from the bedroom.   Wash drapes weekly or use plastic window coverings. 5. Remove all non-washable stuffed toys from the bedroom.  Wash stuffed toys weekly. 6. Have the room cleaned frequently with a vacuum cleaner and a damp dust-mop.  The patient should not be in a room which is being cleaned and should wait 1 hour after cleaning before going into the room. 7. Close and seal all heating outlets in the bedroom.  Otherwise, the room will become filled with dust-laden air.  An electric heater can be used to heat the room. Reduce indoor humidity to less than 50%.  Do not use a humidifier.  Control of Dog or Cat Allergen  Avoidance is the best way to manage a dog or cat allergy. If you have a dog or cat and are allergic to dog or cats, consider removing the dog or cat from the home. If you have a dog or cat but don't want to find it a new home, or if your family wants a pet even though someone in the household is allergic, here are some strategies that may help keep symptoms at bay:  1. Keep the pet out of your bedroom and restrict it to only a few rooms. Be advised that keeping the dog or cat in only one room will not limit the allergens to that room. 2. Don't pet, hug or kiss the dog or cat; if you do, wash your hands with soap and water. 3. High-efficiency particulate air (HEPA) cleaners run continuously in a bedroom or living room can reduce allergen levels over time. 4. Regular use of a high-efficiency vacuum cleaner or a central vacuum can reduce allergen levels. 5. Giving your dog or cat a bath at least once a week can reduce airborne allergen.  Control of Mold Allergen  Mold and fungi can grow on a variety of surfaces provided certain temperature and moisture conditions exist.  Outdoor molds grow on plants, decaying vegetation and soil.  The major outdoor mold, Alternaria and Cladosporium, are found in very high numbers during hot and dry conditions.  Generally, a late Summer - Fall peak is seen for common  outdoor fungal spores.  Rain will temporarily lower outdoor mold spore count, but counts rise rapidly when the rainy period ends.  The most important indoor molds are Aspergillus and Penicillium.  Dark, humid and poorly ventilated basements are ideal sites for mold growth.  The next most common sites of mold growth are the bathroom and the kitchen.  Outdoor [employer] 1. Use air conditioning and keep windows closed 2. Avoid exposure to decaying vegetation. 3. Avoid leaf raking. 4. Avoid grain handling. 5. Consider wearing a face mask if working in moldy areas.  Indoor Mold Control 1. Maintain humidity below 50%. 2. Clean washable surfaces with 5% bleach solution. 3. Remove sources e.g. Contaminated carpets.  Control of Cockroach Allergen  Cockroach allergen has been identified as an important cause of acute attacks of asthma, especially in urban settings.  There are fifty-five species of cockroach that exist in the [place], however only three, the [place], [place] species produce allergen that can affect patients with Asthma.  Allergens can be obtained from fecal particles, egg casings and secretions from cockroaches.    1. Remove food sources. 2. Reduce access to water. 3.  Seal access and entry points. 4. Spray runways with 0.5-1% Diazinon or Chlorpyrifos 5. Blow boric acid power under stoves and refrigerator. 6. Place bait stations (hydramethylnon) at feeding sites.

## 2017-03-14 NOTE — Assessment & Plan Note (Signed)
Gastrointestinal symptoms, unclear etiology. Skin tests to select food allergens were negative today. The negative predictive value of food allergen skin testing is excellent (approximately 95%). While this does not appear to be an IgE mediated issue, skin testing does not rule out food intolerances or cell-mediated enteropathies which may lend to GI symptoms. These etiologies are suggested when elimination of the responsible food leads to symptom resolution and re-introduction of the food is followed by the return of symptoms.   The patient has been encouraged to keep a careful symptom/food journal and eliminate any food suspected of correlating with symptoms.  For now, continue avoidance of products containing cow's milk, gluten, and egg.  If GI symptoms persist or progress, gastroenterologist evaluation may be warranted.

## 2017-03-14 NOTE — Progress Notes (Signed)
New Patient Note  RE: [name] MRN: [id number] DOB: [birth year] Date of Office Visit: 03/14/2017  Referring provider: Lollie Sails, MD Primary care provider: Lollie Sails, MD  Chief Complaint: Allergic Rhinitis  and Food Intolerance   History of present illness: Wendy Lane is a 66 y.o. female seen today in consultation requested by Lollie Sails, MD.  She complains of copious postnasal drainage, hoarseness, coughing, sneezing, rhinorrhea, nasal congestion, and occasional sinus pressure.  These symptoms occur year around but tend to be more frequent and severe in the springtime and in the fall.  Specific triggers include pollen exposure and possible cat exposure.  She complains of increased nasal mucus production as well as diarrhea when she consumes dairy products.  She experiences diarrhea with gluten.  In the past, she showed some reactivity on IgG test to egg so she avoids this food as well as dairy products and gluten.   Assessment and plan: Perennial and seasonal allergic rhinitis  Aeroallergen avoidance measures have been discussed and provided in written form.  A prescription has been provided for azelastine nasal spray, 1-2 sprays per nostril 2 times daily as needed. Proper nasal spray technique has been discussed and demonstrated.   If needed, add fluticasone nasal spray, 2 sprays per nostril daily.  A prescription has been provided.  I have also recommended nasal saline spray (i.e., Simply Saline) or nasal saline lavage (i.e., NeilMed) as needed and prior to medicated nasal sprays.  For thick post nasal drainage, add guaifenesin [phone] mg (Mucinex)  twice daily as needed with adequate hydration as discussed.  If allergen avoidance measures and medications fail to adequately relieve symptoms, aeroallergen immunotherapy will be considered.  Eustachian tube dysfunction  Azelastine nasal spray, fluticasone nasal spray, and nasal saline irrigation  (as above).  With this treatment plan, see if you are able to discontinue hydrochlorothiazide.  History of food allergy Gastrointestinal symptoms, unclear etiology. Skin tests to select food allergens were negative today. The negative predictive value of food allergen skin testing is excellent (approximately 95%). While this does not appear to be an IgE mediated issue, skin testing does not rule out food intolerances or cell-mediated enteropathies which may lend to GI symptoms. These etiologies are suggested when elimination of the responsible food leads to symptom resolution and re-introduction of the food is followed by the return of symptoms.   The patient has been encouraged to keep a careful symptom/food journal and eliminate any food suspected of correlating with symptoms.  For now, continue avoidance of products containing cow's milk, gluten, and egg.  If GI symptoms persist or progress, gastroenterologist evaluation may be warranted.   Meds ordered this encounter  Medications  . fluticasone (FLONASE) 50 MCG/ACT nasal spray    Sig: Place 2 sprays into both nostrils daily.    Dispense:  16 g    Refill:  5  . azelastine (ASTELIN) 0.1 % nasal spray    Sig: Place 1-2 sprays into both nostrils 2 (two) times daily as needed for rhinitis.    Dispense:  30 mL    Refill:  5    Diagnostics: Epicutaneous testing: Positive to cockroach antigen. Intradermal testing: Positive to grass pollens, ragweed pollen, tree pollen, molds, cat hair, and dust mite antigen. Food allergen skin testing:  Negative despite a positive histamine control.    Physical examination: Blood pressure 136/76, pulse 77, temperature 98 F (36.7 C), temperature source Oral, resp. rate 16, height 5' 3.25" (1.607 m), weight 256 lb (116.1  kg), SpO2 97 %.  General: Alert, interactive, in no acute distress. HEENT: TMs pearly gray, turbinates moderately edematous without discharge, post-pharynx moderately  erythematous. Neck: Supple without lymphadenopathy. Lungs: Clear to auscultation without wheezing, rhonchi or rales. CV: Normal S1, S2 without murmurs. Abdomen: Nondistended, nontender. Skin: Warm and dry, without lesions or rashes. Extremities:  No clubbing, cyanosis or edema. Neuro:   Grossly intact.  Review of systems:  Review of systems negative except as noted in HPI / PMHx or noted below: Review of Systems  Constitutional: Negative.   HENT: Negative.   Eyes: Negative.   Respiratory: Negative.   Cardiovascular: Negative.   Gastrointestinal: Negative.   Genitourinary: Negative.   Musculoskeletal: Negative.   Skin: Negative.   Neurological: Negative.   Endo/Heme/Allergies: Negative.   Psychiatric/Behavioral: Negative.     Past medical history:  Past Medical History:  Diagnosis Date  . Allergy   . Hypertension     Past surgical history:  Past Surgical History:  Procedure Laterality Date  . ADENOIDECTOMY  1958  . BREAST LUMPECTOMY  1970's  . CHOLECYSTECTOMY  1987  . FOOT SURGERY  2010   torn tendon  . MYOMECTOMY  1993   d/c  . [place]  . TUBAL LIGATION  1977    Family history: Family History  Problem Relation Age of Onset  . Lung cancer Father        smoker  . Allergies Unknown        mother and father side of family  . Diabetes Brother   . Asthma Neg Hx   . Allergic rhinitis Neg Hx   . Eczema Neg Hx   . Immunodeficiency Neg Hx   . Urticaria Neg Hx   . Angioedema Neg Hx     Social history: Social History   Social History  . Marital status: Divorced    Spouse name: N/A  . Number of children: 0  . Years of education: N/A   Occupational History  . grief specalist    Social History Main Topics  . Smoking status: Never Smoker  . Smokeless tobacco: Never Used  . Alcohol use 0.0 oz/week     Comment: social(maybe 2 a year)  . Drug use: No  . Sexual activity: Not on file   Other Topics Concern  . Not on file   Social History  Narrative  . No narrative on file   Environmental History: The patient lives in a condominium built in [place] with hardwood floors throughout and carpeting in the bedroom.  There is gas heat and central air.  There are 2 cats in the home which have access to her bedroom.  There is no known water/mold damage in the home.  She is a nonsmoker.  Allergies as of 03/14/2017      Reactions   Diflucan [fluconazole] Nausea And Vomiting   Sulfa Antibiotics    As a child-? Itching feelings      Medication List       Accurate as of 03/14/17 12:59 PM. Always use your most recent med list.          aspirin 81 MG tablet Take 81 mg by mouth daily.   azelastine 0.1 % nasal spray Commonly known as:  ASTELIN Place 1-2 sprays into both nostrils 2 (two) times daily as needed for rhinitis.   cholecalciferol 1000 units tablet Commonly known as:  VITAMIN D Take 1,000 Units by mouth daily.   cyanocobalamin 1000 MCG/ML injection Commonly known as:  (  VITAMIN B-12) Injection weekly   DHA PO Take 10 mg by mouth daily.   estradiol 0.05 MG/24HR patch Commonly known as:  VIVELLE-DOT Place 1 patch onto the skin 2 (two) times a week.   fluticasone 50 MCG/ACT nasal spray Commonly known as:  FLONASE Place 2 sprays into both nostrils daily.   hydrochlorothiazide 12.5 MG tablet Commonly known as:  HYDRODIURIL Take 12.5 mg by mouth daily.   ibuprofen 600 MG tablet Commonly known as:  ADVIL,MOTRIN Take 1 tablet (600 mg total) by mouth every 8 (eight) hours as needed for mild pain or moderate pain.   IODINE (KELP) PO Take 12.5 mg by mouth daily.   levothyroxine 112 MCG tablet Commonly known as:  SYNTHROID, LEVOTHROID Take 1 tablet by mouth daily.   losartan 100 MG tablet Commonly known as:  COZAAR 100 mg daily.   magnesium gluconate 500 MG tablet Commonly known as:  MAGONATE Take 500 mg by mouth 2 (two) times daily.   Melatonin 1 MG Tabs Take 1 mg by mouth at bedtime.   N-ACETYL-L-CYSTEINE  PO Take 1 tablet by mouth daily.   OMEGA 3 PO Take 1,200 mg by mouth daily.   progesterone 200 MG Supp Place 200 mg vaginally at bedtime.   TURMERIC CURCUMIN PO Take 1,000 mg by mouth 2 (two) times daily.   vitamin A 10000 UNIT capsule Take 10,000 Units by mouth daily.   VITAMIN E PO Take 832 mg by mouth daily.       Known medication allergies: Allergies  Allergen Reactions  . Diflucan [Fluconazole] Nausea And Vomiting  . Sulfa Antibiotics     As a child-? Itching feelings    I appreciate the opportunity to take part in Lacinda's care. Please do not hesitate to contact me with questions.  Sincerely,   R. Edgar Frisk, MD

## 2017-03-14 NOTE — Assessment & Plan Note (Addendum)
   Azelastine nasal spray, fluticasone nasal spray, and nasal saline irrigation (as above).  With this treatment plan, see if you are able to discontinue hydrochlorothiazide.

## 2017-04-29 ENCOUNTER — Emergency Department: Admission: EM | Admit: 2017-04-29 | Discharge: 2017-04-29 | Discharge status: Absent without leave | Payer: PPO

## 2017-09-23 DIAGNOSIS — Z79899 Other long term (current) drug therapy: Secondary | ICD-10-CM | POA: Diagnosis not present

## 2017-09-23 DIAGNOSIS — S8991XA Unspecified injury of right lower leg, initial encounter: Secondary | ICD-10-CM | POA: Diagnosis not present

## 2017-09-23 DIAGNOSIS — Z882 Allergy status to sulfonamides status: Secondary | ICD-10-CM | POA: Diagnosis not present

## 2017-09-23 DIAGNOSIS — M25569 Pain in unspecified knee: Secondary | ICD-10-CM | POA: Diagnosis not present

## 2017-09-23 DIAGNOSIS — Y998 Other external cause status: Secondary | ICD-10-CM | POA: Diagnosis not present

## 2017-09-23 DIAGNOSIS — I10 Essential (primary) hypertension: Secondary | ICD-10-CM | POA: Diagnosis not present

## 2017-09-23 DIAGNOSIS — M25561 Pain in right knee: Secondary | ICD-10-CM | POA: Diagnosis not present

## 2017-09-23 DIAGNOSIS — M7121 Synovial cyst of popliteal space [Baker], right knee: Secondary | ICD-10-CM | POA: Diagnosis not present

## 2017-09-23 DIAGNOSIS — X509XXA Other and unspecified overexertion or strenuous movements or postures, initial encounter: Secondary | ICD-10-CM | POA: Diagnosis not present

## 2017-09-25 DIAGNOSIS — S83241A Other tear of medial meniscus, current injury, right knee, initial encounter: Secondary | ICD-10-CM | POA: Diagnosis not present

## 2017-10-16 DIAGNOSIS — S83241D Other tear of medial meniscus, current injury, right knee, subsequent encounter: Secondary | ICD-10-CM | POA: Diagnosis not present

## 2017-10-16 DIAGNOSIS — M170 Bilateral primary osteoarthritis of knee: Secondary | ICD-10-CM | POA: Diagnosis not present

## 2017-10-22 DIAGNOSIS — S83241D Other tear of medial meniscus, current injury, right knee, subsequent encounter: Secondary | ICD-10-CM | POA: Diagnosis not present

## 2017-10-22 DIAGNOSIS — S83281A Other tear of lateral meniscus, current injury, right knee, initial encounter: Secondary | ICD-10-CM | POA: Diagnosis not present

## 2017-11-01 DIAGNOSIS — M2241 Chondromalacia patellae, right knee: Secondary | ICD-10-CM | POA: Diagnosis not present

## 2017-11-14 DIAGNOSIS — M2241 Chondromalacia patellae, right knee: Secondary | ICD-10-CM | POA: Diagnosis not present

## 2017-11-14 DIAGNOSIS — G8929 Other chronic pain: Secondary | ICD-10-CM | POA: Diagnosis not present

## 2017-11-14 DIAGNOSIS — R29898 Other symptoms and signs involving the musculoskeletal system: Secondary | ICD-10-CM | POA: Diagnosis not present

## 2017-11-14 DIAGNOSIS — M25561 Pain in right knee: Secondary | ICD-10-CM | POA: Diagnosis not present

## 2017-11-20 DIAGNOSIS — G8929 Other chronic pain: Secondary | ICD-10-CM | POA: Diagnosis not present

## 2017-11-20 DIAGNOSIS — R29898 Other symptoms and signs involving the musculoskeletal system: Secondary | ICD-10-CM | POA: Diagnosis not present

## 2017-11-20 DIAGNOSIS — M25561 Pain in right knee: Secondary | ICD-10-CM | POA: Diagnosis not present

## 2017-11-22 DIAGNOSIS — M25561 Pain in right knee: Secondary | ICD-10-CM | POA: Diagnosis not present

## 2017-11-22 DIAGNOSIS — G8929 Other chronic pain: Secondary | ICD-10-CM | POA: Diagnosis not present

## 2017-11-22 DIAGNOSIS — R29898 Other symptoms and signs involving the musculoskeletal system: Secondary | ICD-10-CM | POA: Diagnosis not present

## 2017-11-26 DIAGNOSIS — M25561 Pain in right knee: Secondary | ICD-10-CM | POA: Diagnosis not present

## 2017-11-26 DIAGNOSIS — G8929 Other chronic pain: Secondary | ICD-10-CM | POA: Diagnosis not present

## 2017-11-26 DIAGNOSIS — R29898 Other symptoms and signs involving the musculoskeletal system: Secondary | ICD-10-CM | POA: Diagnosis not present

## 2017-12-13 DIAGNOSIS — Z0001 Encounter for general adult medical examination with abnormal findings: Secondary | ICD-10-CM | POA: Diagnosis not present

## 2017-12-13 DIAGNOSIS — R7301 Impaired fasting glucose: Secondary | ICD-10-CM | POA: Diagnosis not present

## 2018-01-23 DIAGNOSIS — Z1231 Encounter for screening mammogram for malignant neoplasm of breast: Secondary | ICD-10-CM | POA: Diagnosis not present

## 2018-01-30 DIAGNOSIS — M25551 Pain in right hip: Secondary | ICD-10-CM | POA: Diagnosis not present

## 2018-01-30 DIAGNOSIS — M5136 Other intervertebral disc degeneration, lumbar region: Secondary | ICD-10-CM | POA: Diagnosis not present

## 2018-01-31 ENCOUNTER — Other Ambulatory Visit: Payer: Self-pay | Admitting: Orthopedic Surgery

## 2018-01-31 DIAGNOSIS — M545 Low back pain, unspecified: Secondary | ICD-10-CM

## 2018-02-04 DIAGNOSIS — E039 Hypothyroidism, unspecified: Secondary | ICD-10-CM | POA: Diagnosis not present

## 2018-02-04 DIAGNOSIS — I10 Essential (primary) hypertension: Secondary | ICD-10-CM | POA: Diagnosis not present

## 2018-02-04 DIAGNOSIS — L989 Disorder of the skin and subcutaneous tissue, unspecified: Secondary | ICD-10-CM | POA: Diagnosis not present

## 2018-02-06 DIAGNOSIS — L820 Inflamed seborrheic keratosis: Secondary | ICD-10-CM | POA: Diagnosis not present

## 2018-02-10 ENCOUNTER — Other Ambulatory Visit: Payer: PPO

## 2018-02-14 ENCOUNTER — Ambulatory Visit
Admission: RE | Admit: 2018-02-14 | Discharge: 2018-02-14 | Disposition: A | Discharge status: Home / Self Care | Payer: PPO | Source: Ambulatory Visit | Attending: Orthopedic Surgery | Admitting: Orthopedic Surgery

## 2018-02-14 DIAGNOSIS — M545 Low back pain, unspecified: Secondary | ICD-10-CM

## 2018-02-19 DIAGNOSIS — M545 Low back pain: Secondary | ICD-10-CM | POA: Diagnosis not present

## 2018-02-19 DIAGNOSIS — Z789 Other specified health status: Secondary | ICD-10-CM | POA: Diagnosis not present

## 2018-02-25 DIAGNOSIS — M47896 Other spondylosis, lumbar region: Secondary | ICD-10-CM | POA: Diagnosis not present

## 2018-02-25 DIAGNOSIS — M545 Low back pain: Secondary | ICD-10-CM | POA: Diagnosis not present

## 2018-02-25 DIAGNOSIS — M5416 Radiculopathy, lumbar region: Secondary | ICD-10-CM | POA: Diagnosis not present

## 2018-02-25 DIAGNOSIS — M79604 Pain in right leg: Secondary | ICD-10-CM | POA: Diagnosis not present

## 2018-02-27 DIAGNOSIS — M79604 Pain in right leg: Secondary | ICD-10-CM | POA: Diagnosis not present

## 2018-02-27 DIAGNOSIS — M545 Low back pain: Secondary | ICD-10-CM | POA: Diagnosis not present

## 2018-02-27 DIAGNOSIS — M5416 Radiculopathy, lumbar region: Secondary | ICD-10-CM | POA: Diagnosis not present

## 2018-02-27 DIAGNOSIS — M47896 Other spondylosis, lumbar region: Secondary | ICD-10-CM | POA: Diagnosis not present

## 2018-03-04 DIAGNOSIS — M545 Low back pain: Secondary | ICD-10-CM | POA: Diagnosis not present

## 2018-03-04 DIAGNOSIS — M47896 Other spondylosis, lumbar region: Secondary | ICD-10-CM | POA: Diagnosis not present

## 2018-03-04 DIAGNOSIS — M79604 Pain in right leg: Secondary | ICD-10-CM | POA: Diagnosis not present

## 2018-03-04 DIAGNOSIS — M5416 Radiculopathy, lumbar region: Secondary | ICD-10-CM | POA: Diagnosis not present

## 2018-03-06 DIAGNOSIS — M47896 Other spondylosis, lumbar region: Secondary | ICD-10-CM | POA: Diagnosis not present

## 2018-03-06 DIAGNOSIS — M79604 Pain in right leg: Secondary | ICD-10-CM | POA: Diagnosis not present

## 2018-03-06 DIAGNOSIS — M545 Low back pain: Secondary | ICD-10-CM | POA: Diagnosis not present

## 2018-03-06 DIAGNOSIS — M5416 Radiculopathy, lumbar region: Secondary | ICD-10-CM | POA: Diagnosis not present

## 2018-03-11 DIAGNOSIS — L989 Disorder of the skin and subcutaneous tissue, unspecified: Secondary | ICD-10-CM | POA: Diagnosis not present

## 2018-03-11 DIAGNOSIS — E039 Hypothyroidism, unspecified: Secondary | ICD-10-CM | POA: Diagnosis not present

## 2018-03-11 DIAGNOSIS — I10 Essential (primary) hypertension: Secondary | ICD-10-CM | POA: Diagnosis not present

## 2018-06-09 DIAGNOSIS — I10 Essential (primary) hypertension: Secondary | ICD-10-CM | POA: Diagnosis not present

## 2018-06-09 DIAGNOSIS — G459 Transient cerebral ischemic attack, unspecified: Secondary | ICD-10-CM | POA: Diagnosis not present

## 2018-06-24 DIAGNOSIS — R55 Syncope and collapse: Secondary | ICD-10-CM | POA: Diagnosis not present

## 2018-06-24 DIAGNOSIS — Z8673 Personal history of transient ischemic attack (TIA), and cerebral infarction without residual deficits: Secondary | ICD-10-CM | POA: Diagnosis not present

## 2018-06-24 DIAGNOSIS — I10 Essential (primary) hypertension: Secondary | ICD-10-CM | POA: Diagnosis not present

## 2018-06-24 DIAGNOSIS — I639 Cerebral infarction, unspecified: Secondary | ICD-10-CM | POA: Diagnosis not present

## 2018-06-24 DIAGNOSIS — R7309 Other abnormal glucose: Secondary | ICD-10-CM | POA: Diagnosis not present

## 2018-06-24 DIAGNOSIS — R42 Dizziness and giddiness: Secondary | ICD-10-CM | POA: Diagnosis not present

## 2018-06-24 DIAGNOSIS — R946 Abnormal results of thyroid function studies: Secondary | ICD-10-CM | POA: Diagnosis not present

## 2018-06-25 DIAGNOSIS — E039 Hypothyroidism, unspecified: Secondary | ICD-10-CM | POA: Diagnosis not present

## 2018-06-25 DIAGNOSIS — M545 Low back pain: Secondary | ICD-10-CM | POA: Diagnosis not present

## 2018-06-25 DIAGNOSIS — Z882 Allergy status to sulfonamides status: Secondary | ICD-10-CM | POA: Diagnosis not present

## 2018-06-25 DIAGNOSIS — Z79899 Other long term (current) drug therapy: Secondary | ICD-10-CM | POA: Diagnosis not present

## 2018-06-25 DIAGNOSIS — I10 Essential (primary) hypertension: Secondary | ICD-10-CM | POA: Diagnosis not present

## 2018-06-25 DIAGNOSIS — E785 Hyperlipidemia, unspecified: Secondary | ICD-10-CM | POA: Diagnosis not present

## 2018-06-25 DIAGNOSIS — Z7902 Long term (current) use of antithrombotics/antiplatelets: Secondary | ICD-10-CM | POA: Diagnosis not present

## 2018-06-25 DIAGNOSIS — R200 Anesthesia of skin: Secondary | ICD-10-CM | POA: Diagnosis not present

## 2018-06-25 DIAGNOSIS — G8929 Other chronic pain: Secondary | ICD-10-CM | POA: Diagnosis not present

## 2018-06-25 DIAGNOSIS — Z7982 Long term (current) use of aspirin: Secondary | ICD-10-CM | POA: Diagnosis not present

## 2018-06-25 DIAGNOSIS — Z883 Allergy status to other anti-infective agents status: Secondary | ICD-10-CM | POA: Diagnosis not present

## 2018-06-25 DIAGNOSIS — G459 Transient cerebral ischemic attack, unspecified: Secondary | ICD-10-CM | POA: Diagnosis not present

## 2018-06-25 DIAGNOSIS — R42 Dizziness and giddiness: Secondary | ICD-10-CM | POA: Diagnosis not present

## 2018-06-25 DIAGNOSIS — I071 Rheumatic tricuspid insufficiency: Secondary | ICD-10-CM | POA: Diagnosis not present

## 2018-06-26 DIAGNOSIS — R42 Dizziness and giddiness: Secondary | ICD-10-CM | POA: Diagnosis not present

## 2018-06-26 DIAGNOSIS — I10 Essential (primary) hypertension: Secondary | ICD-10-CM | POA: Diagnosis not present

## 2018-06-26 DIAGNOSIS — E039 Hypothyroidism, unspecified: Secondary | ICD-10-CM | POA: Diagnosis not present

## 2018-06-26 DIAGNOSIS — G459 Transient cerebral ischemic attack, unspecified: Secondary | ICD-10-CM | POA: Diagnosis not present

## 2018-06-26 DIAGNOSIS — E785 Hyperlipidemia, unspecified: Secondary | ICD-10-CM | POA: Diagnosis not present

## 2018-06-27 ENCOUNTER — Other Ambulatory Visit (HOSPITAL_COMMUNITY): Payer: Self-pay | Admitting: Family Medicine

## 2018-06-27 DIAGNOSIS — E785 Hyperlipidemia, unspecified: Secondary | ICD-10-CM | POA: Diagnosis not present

## 2018-06-27 DIAGNOSIS — G459 Transient cerebral ischemic attack, unspecified: Secondary | ICD-10-CM | POA: Diagnosis not present

## 2018-06-27 DIAGNOSIS — I10 Essential (primary) hypertension: Secondary | ICD-10-CM | POA: Diagnosis not present

## 2018-06-27 DIAGNOSIS — E039 Hypothyroidism, unspecified: Secondary | ICD-10-CM | POA: Diagnosis not present

## 2018-06-27 DIAGNOSIS — I6523 Occlusion and stenosis of bilateral carotid arteries: Secondary | ICD-10-CM

## 2018-06-30 ENCOUNTER — Ambulatory Visit (HOSPITAL_COMMUNITY): Admission: RE | Admit: 2018-06-30 | Payer: PPO | Source: Ambulatory Visit

## 2018-06-30 ENCOUNTER — Ambulatory Visit (HOSPITAL_COMMUNITY)
Admission: RE | Admit: 2018-06-30 | Discharge: 2018-06-30 | Disposition: A | Discharge status: Home / Self Care | Payer: PPO | Source: Ambulatory Visit | Attending: Cardiology | Admitting: Cardiology

## 2018-06-30 DIAGNOSIS — I6523 Occlusion and stenosis of bilateral carotid arteries: Secondary | ICD-10-CM | POA: Insufficient documentation

## 2018-06-30 DIAGNOSIS — G459 Transient cerebral ischemic attack, unspecified: Secondary | ICD-10-CM

## 2018-06-30 DIAGNOSIS — R29818 Other symptoms and signs involving the nervous system: Secondary | ICD-10-CM | POA: Diagnosis not present

## 2018-06-30 DIAGNOSIS — I10 Essential (primary) hypertension: Secondary | ICD-10-CM | POA: Diagnosis not present

## 2018-06-30 DIAGNOSIS — H8113 Benign paroxysmal vertigo, bilateral: Secondary | ICD-10-CM | POA: Diagnosis not present

## 2018-06-30 DIAGNOSIS — E039 Hypothyroidism, unspecified: Secondary | ICD-10-CM | POA: Diagnosis not present

## 2018-07-01 ENCOUNTER — Other Ambulatory Visit: Payer: Self-pay

## 2018-07-01 NOTE — Patient Outreach (Signed)
[facility]) Care Management  07/01/2018  [place] 1951-08-31 [id number]      Transition of Care Referral  Referral Date: 07/01/18 Referral Source: HTA Discharge Report Date of Admission: unknown Diagnosis: unknown Date of Discharge: 06/27/18 Facility: [place]: HTA    Outreach attempt # 1 to patient. No answer at present.     Plan: RN CM will make outreach attempt to patient within 3-4 business days. RN CM will send unsuccessful outreach letter to patient.    Enzo Montgomery, RN,BSN,CCM [place] Management Telephonic Care Management Coordinator Direct Phone: [phone] Toll Free: [phone] Fax: [phone]

## 2018-07-02 ENCOUNTER — Other Ambulatory Visit: Payer: Self-pay

## 2018-07-02 NOTE — Patient Outreach (Signed)
[facility]) Care Management  07/02/2018  Wendy Lane Jul 07, 1951 [id number]   Transition of Care Referral  Referral Date:07/01/18 Referral Source:HTA Discharge Report Date of Admission:unknown Diagnosis:unknown Date of Discharge:06/27/18 [place]    Incoming call from patient returning RN CM call. Spoke with patient. She was very guarded and brief with her responses. She voices that she has all her meds and denies any issues or concerns regarding them. RN CM confirmed that patient has made MD follow up appts. She denies any issues with transportation. She did not wish to continue further TOC assessment. She declined needing anything. Advised patient that RN CM had mailed out [facility] brochure and info via mail for her to review and to feel free if to call in the future for any needs or concerns.     Plan: RN CM will close case at this time.  Enzo Montgomery, RN,BSN,CCM [place] Management Telephonic Care Management Coordinator Direct Phone: [phone] Toll Free: [phone] Fax: [phone]

## 2018-07-02 NOTE — Patient Outreach (Signed)
[facility]) Care Management  07/02/2018  [place] December 10, 1950 [id number]   Transition of Care Referral  Referral Date: 07/01/18 Referral Source: HTA Discharge Report Date of Admission: unknown Diagnosis: unknown Date of Discharge: 06/27/18 Facility: [facility]: HTA     Outreach attempt #2 to patient. No answer at present and unable to leave message.      Plan:  RN CM will make outreach attempt to patient within 3-4 business days.  Enzo Montgomery, RN,BSN,CCM [place] Management Telephonic Care Management Coordinator Direct Phone: [phone] Toll Free: [phone] Fax: [phone]

## 2018-07-04 DIAGNOSIS — H2513 Age-related nuclear cataract, bilateral: Secondary | ICD-10-CM | POA: Diagnosis not present

## 2018-07-04 DIAGNOSIS — H52223 Regular astigmatism, bilateral: Secondary | ICD-10-CM | POA: Diagnosis not present

## 2018-07-04 DIAGNOSIS — H40053 Ocular hypertension, bilateral: Secondary | ICD-10-CM | POA: Diagnosis not present

## 2018-07-04 DIAGNOSIS — H5203 Hypermetropia, bilateral: Secondary | ICD-10-CM | POA: Diagnosis not present

## 2018-07-09 DIAGNOSIS — R42 Dizziness and giddiness: Secondary | ICD-10-CM | POA: Diagnosis not present

## 2018-07-09 DIAGNOSIS — R2681 Unsteadiness on feet: Secondary | ICD-10-CM | POA: Diagnosis not present

## 2018-07-09 DIAGNOSIS — H81399 Other peripheral vertigo, unspecified ear: Secondary | ICD-10-CM | POA: Diagnosis not present

## 2018-07-09 DIAGNOSIS — R262 Difficulty in walking, not elsewhere classified: Secondary | ICD-10-CM | POA: Diagnosis not present

## 2018-07-17 DIAGNOSIS — R42 Dizziness and giddiness: Secondary | ICD-10-CM | POA: Diagnosis not present

## 2018-07-17 DIAGNOSIS — H6982 Other specified disorders of Eustachian tube, left ear: Secondary | ICD-10-CM | POA: Diagnosis not present

## 2018-07-17 DIAGNOSIS — J309 Allergic rhinitis, unspecified: Secondary | ICD-10-CM | POA: Diagnosis not present

## 2018-08-01 DIAGNOSIS — H8120 Vestibular neuronitis, unspecified ear: Secondary | ICD-10-CM | POA: Diagnosis not present

## 2018-08-01 DIAGNOSIS — R42 Dizziness and giddiness: Secondary | ICD-10-CM | POA: Diagnosis not present

## 2018-08-04 DIAGNOSIS — R42 Dizziness and giddiness: Secondary | ICD-10-CM | POA: Diagnosis not present

## 2018-08-04 DIAGNOSIS — Z0110 Encounter for examination of ears and hearing without abnormal findings: Secondary | ICD-10-CM | POA: Diagnosis not present

## 2018-08-25 DIAGNOSIS — H8120 Vestibular neuronitis, unspecified ear: Secondary | ICD-10-CM | POA: Diagnosis not present

## 2018-10-07 DIAGNOSIS — H2513 Age-related nuclear cataract, bilateral: Secondary | ICD-10-CM | POA: Diagnosis not present

## 2018-10-07 DIAGNOSIS — H02831 Dermatochalasis of right upper eyelid: Secondary | ICD-10-CM | POA: Diagnosis not present

## 2018-10-07 DIAGNOSIS — H2511 Age-related nuclear cataract, right eye: Secondary | ICD-10-CM | POA: Diagnosis not present

## 2018-10-07 DIAGNOSIS — H25043 Posterior subcapsular polar age-related cataract, bilateral: Secondary | ICD-10-CM | POA: Diagnosis not present

## 2018-10-07 DIAGNOSIS — H25013 Cortical age-related cataract, bilateral: Secondary | ICD-10-CM | POA: Diagnosis not present

## 2018-10-07 DIAGNOSIS — H18413 Arcus senilis, bilateral: Secondary | ICD-10-CM | POA: Diagnosis not present

## 2018-11-03 DIAGNOSIS — J449 Chronic obstructive pulmonary disease, unspecified: Secondary | ICD-10-CM | POA: Diagnosis not present

## 2018-11-03 DIAGNOSIS — J040 Acute laryngitis: Secondary | ICD-10-CM | POA: Diagnosis not present

## 2018-11-03 DIAGNOSIS — J309 Allergic rhinitis, unspecified: Secondary | ICD-10-CM | POA: Diagnosis not present

## 2018-11-03 DIAGNOSIS — Z77120 Contact with and (suspected) exposure to mold (toxic): Secondary | ICD-10-CM | POA: Diagnosis not present

## 2018-11-03 DIAGNOSIS — J370 Chronic laryngitis: Secondary | ICD-10-CM | POA: Diagnosis not present

## 2018-11-03 DIAGNOSIS — J42 Unspecified chronic bronchitis: Secondary | ICD-10-CM | POA: Diagnosis not present

## 2018-11-27 DIAGNOSIS — R7309 Other abnormal glucose: Secondary | ICD-10-CM | POA: Diagnosis not present

## 2018-11-27 DIAGNOSIS — E559 Vitamin D deficiency, unspecified: Secondary | ICD-10-CM | POA: Diagnosis not present

## 2018-11-27 DIAGNOSIS — I10 Essential (primary) hypertension: Secondary | ICD-10-CM | POA: Diagnosis not present

## 2018-12-05 DIAGNOSIS — H2512 Age-related nuclear cataract, left eye: Secondary | ICD-10-CM | POA: Diagnosis not present

## 2018-12-05 DIAGNOSIS — H2511 Age-related nuclear cataract, right eye: Secondary | ICD-10-CM | POA: Diagnosis not present

## 2018-12-09 DIAGNOSIS — Z01419 Encounter for gynecological examination (general) (routine) without abnormal findings: Secondary | ICD-10-CM | POA: Diagnosis not present

## 2018-12-09 DIAGNOSIS — Z79890 Hormone replacement therapy (postmenopausal): Secondary | ICD-10-CM | POA: Diagnosis not present

## 2018-12-09 DIAGNOSIS — Z6841 Body Mass Index (BMI) 40.0 and over, adult: Secondary | ICD-10-CM | POA: Diagnosis not present

## 2018-12-09 DIAGNOSIS — N959 Unspecified menopausal and perimenopausal disorder: Secondary | ICD-10-CM | POA: Diagnosis not present

## 2018-12-10 DIAGNOSIS — I10 Essential (primary) hypertension: Secondary | ICD-10-CM | POA: Diagnosis not present

## 2018-12-10 DIAGNOSIS — D510 Vitamin B12 deficiency anemia due to intrinsic factor deficiency: Secondary | ICD-10-CM | POA: Diagnosis not present

## 2018-12-22 DIAGNOSIS — H2512 Age-related nuclear cataract, left eye: Secondary | ICD-10-CM | POA: Diagnosis not present

## 2019-07-22 DIAGNOSIS — Z1231 Encounter for screening mammogram for malignant neoplasm of breast: Secondary | ICD-10-CM | POA: Diagnosis not present

## 2019-07-22 DIAGNOSIS — Z803 Family history of malignant neoplasm of breast: Secondary | ICD-10-CM | POA: Diagnosis not present

## 2019-08-14 DIAGNOSIS — D510 Vitamin B12 deficiency anemia due to intrinsic factor deficiency: Secondary | ICD-10-CM | POA: Diagnosis not present

## 2019-08-14 DIAGNOSIS — R7301 Impaired fasting glucose: Secondary | ICD-10-CM | POA: Diagnosis not present

## 2019-08-14 DIAGNOSIS — I10 Essential (primary) hypertension: Secondary | ICD-10-CM | POA: Diagnosis not present

## 2019-08-14 DIAGNOSIS — R079 Chest pain, unspecified: Secondary | ICD-10-CM | POA: Diagnosis not present

## 2019-08-14 DIAGNOSIS — E039 Hypothyroidism, unspecified: Secondary | ICD-10-CM | POA: Diagnosis not present

## 2019-08-14 DIAGNOSIS — K7581 Nonalcoholic steatohepatitis (NASH): Secondary | ICD-10-CM | POA: Diagnosis not present

## 2019-08-14 DIAGNOSIS — E781 Pure hyperglyceridemia: Secondary | ICD-10-CM | POA: Diagnosis not present

## 2019-08-14 DIAGNOSIS — E559 Vitamin D deficiency, unspecified: Secondary | ICD-10-CM | POA: Diagnosis not present

## 2019-08-14 DIAGNOSIS — R42 Dizziness and giddiness: Secondary | ICD-10-CM | POA: Diagnosis not present

## 2019-08-20 DIAGNOSIS — J370 Chronic laryngitis: Secondary | ICD-10-CM | POA: Diagnosis not present

## 2019-08-20 DIAGNOSIS — B449 Aspergillosis, unspecified: Secondary | ICD-10-CM | POA: Diagnosis not present

## 2019-08-20 DIAGNOSIS — J449 Chronic obstructive pulmonary disease, unspecified: Secondary | ICD-10-CM | POA: Diagnosis not present

## 2019-08-20 DIAGNOSIS — J42 Unspecified chronic bronchitis: Secondary | ICD-10-CM | POA: Diagnosis not present

## 2019-08-20 DIAGNOSIS — J040 Acute laryngitis: Secondary | ICD-10-CM | POA: Diagnosis not present

## 2019-08-20 DIAGNOSIS — Z77120 Contact with and (suspected) exposure to mold (toxic): Secondary | ICD-10-CM | POA: Diagnosis not present

## 2019-08-26 DIAGNOSIS — D510 Vitamin B12 deficiency anemia due to intrinsic factor deficiency: Secondary | ICD-10-CM | POA: Diagnosis not present

## 2019-08-26 DIAGNOSIS — R079 Chest pain, unspecified: Secondary | ICD-10-CM | POA: Diagnosis not present

## 2019-08-26 DIAGNOSIS — I10 Essential (primary) hypertension: Secondary | ICD-10-CM | POA: Diagnosis not present

## 2019-09-17 DIAGNOSIS — E0590 Thyrotoxicosis, unspecified without thyrotoxic crisis or storm: Secondary | ICD-10-CM | POA: Diagnosis not present

## 2019-09-17 DIAGNOSIS — E039 Hypothyroidism, unspecified: Secondary | ICD-10-CM | POA: Diagnosis not present

## 2019-10-16 IMAGING — MR MR LUMBAR SPINE W/O CM
4 of 5 series · 26 of 48 positions shown · non-contrast
Comparison: None.

CLINICAL DATA: Acute low back pain without sciatica

EXAM:
MRI LUMBAR SPINE WITHOUT CONTRAST
TECHNIQUE: Multiplanar, multisequence MR imaging of the lumbar spine was
performed. No intravenous contrast was administered.

[Series 4: T1 · sagittal · 4.0mm · 0.55mm/px · 5 of 15 slices shown (1 of 2)]
[im 1/15]
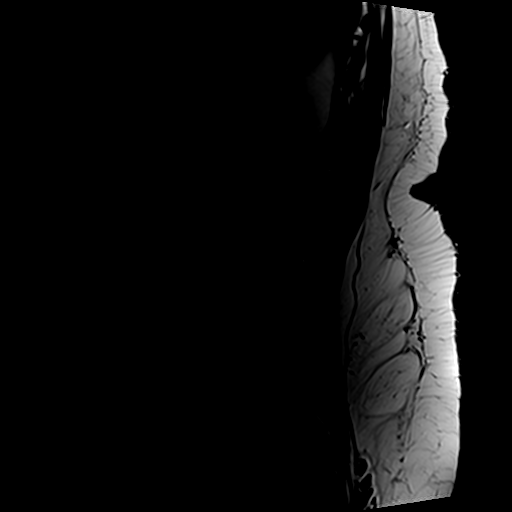
[im 4/15]
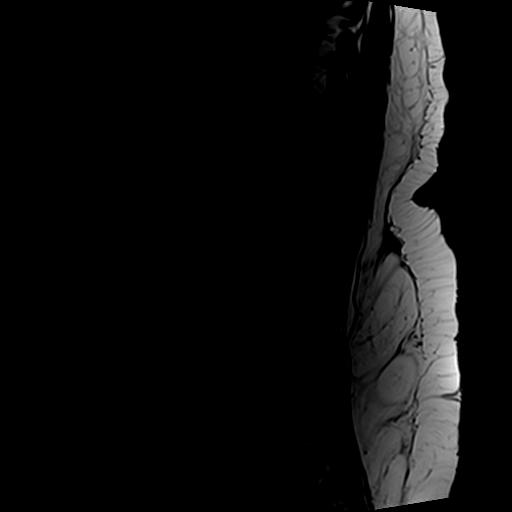
[im 8/15]
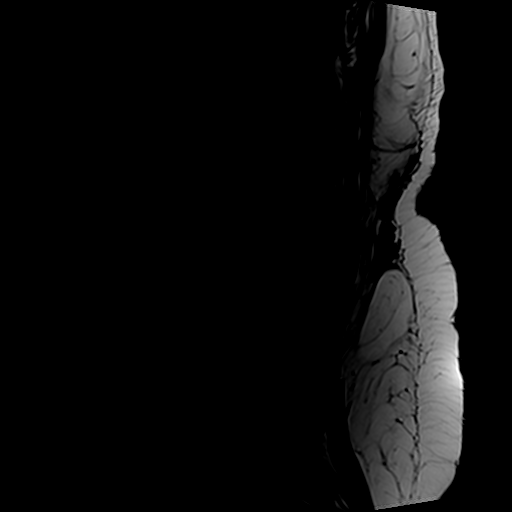
[im 11/15]
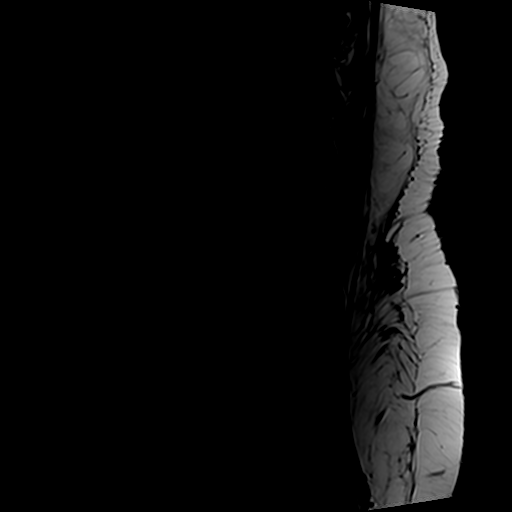
[im 15/15]
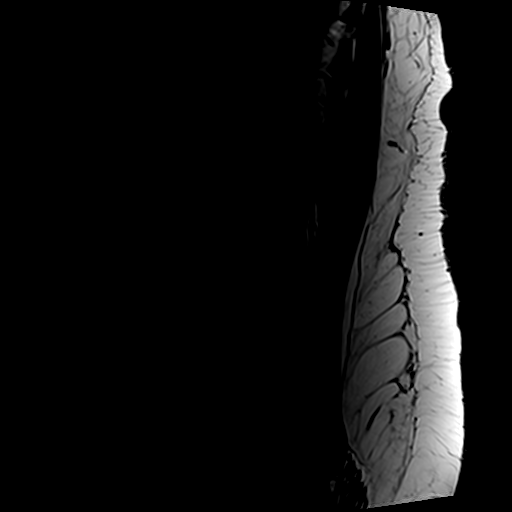

[Series 5: T2 · sagittal · 4.0mm · 0.55mm/px · 5 of 15 slices shown (1 of 2)]
[im 1/15]
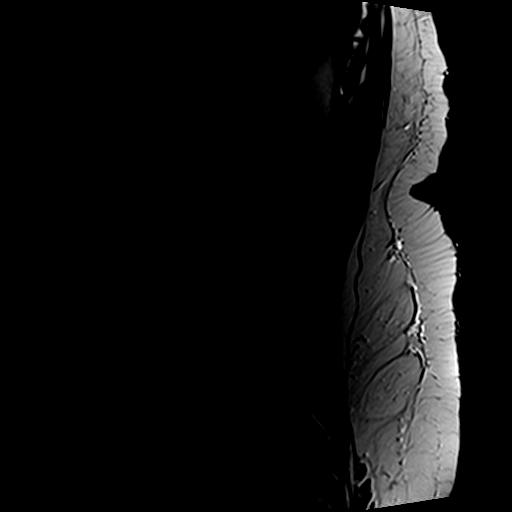
[im 4/15]
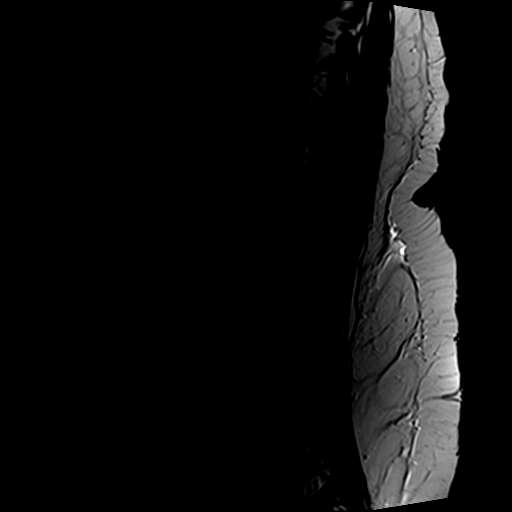
[im 8/15]
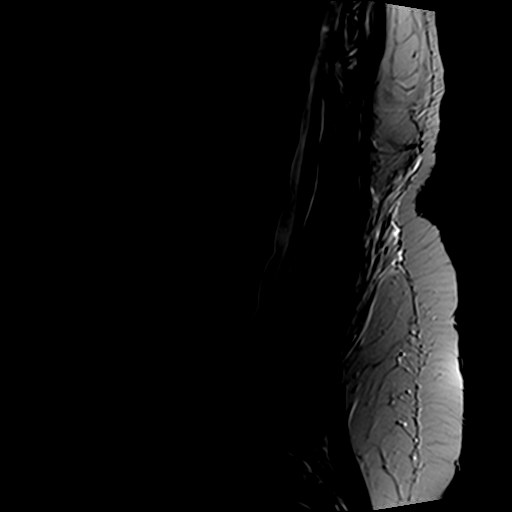
[im 11/15]
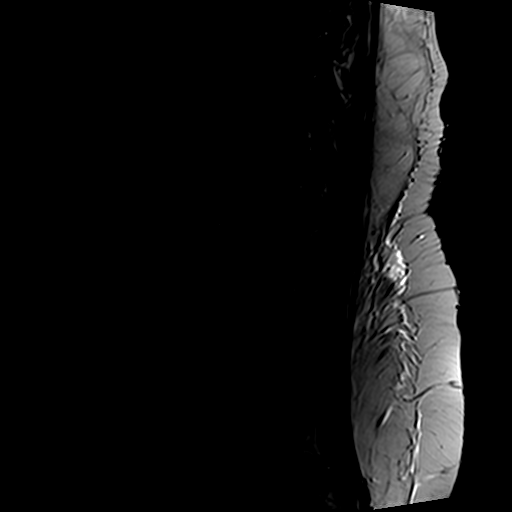
[im 15/15]
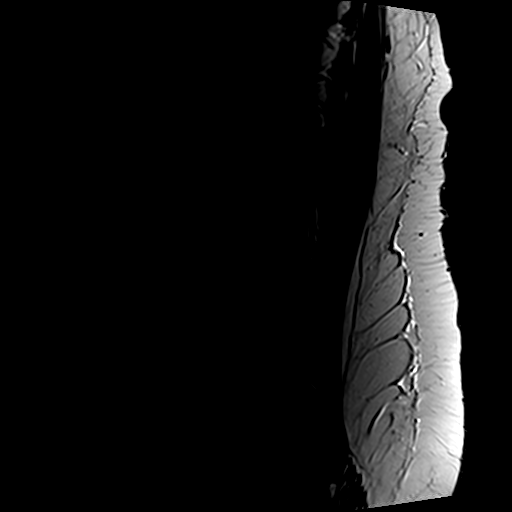

[Series 6: T2 · axial · 4.0mm · 0.70mm/px · z∈[-107,+130]mm · 10 of 49 slices shown (2 of 2)]
[im 4/49]
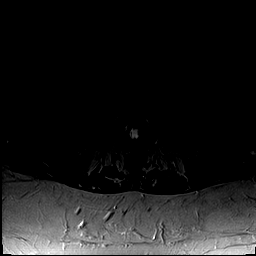
[im 7/49]
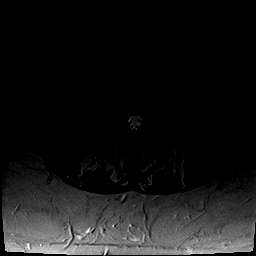
[im 10/49]
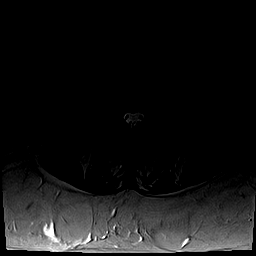
[im 17/49]
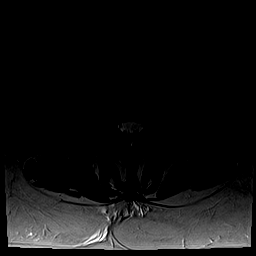
[im 23/49]
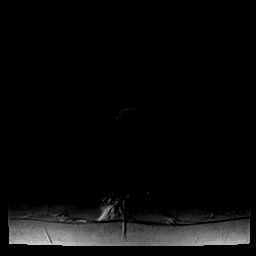
[im 26/49]
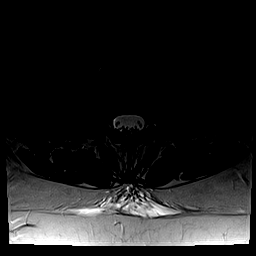
[im 29/49]
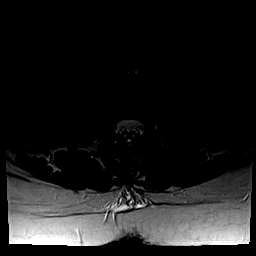
[im 36/49]
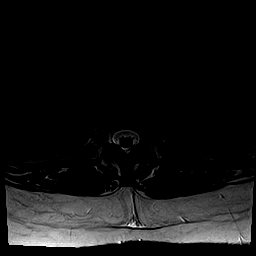
[im 42/49]
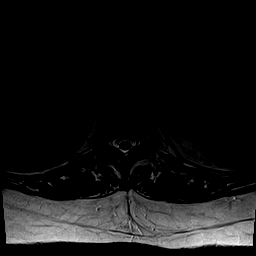
[im 49/49]
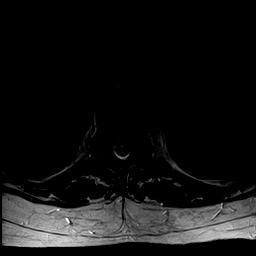

[Series 7: T1 · axial · 4.0mm · 0.35mm/px · z∈[-107,+93]mm · 6 of 49 slices shown (2 of 2)]
[im 4/49]
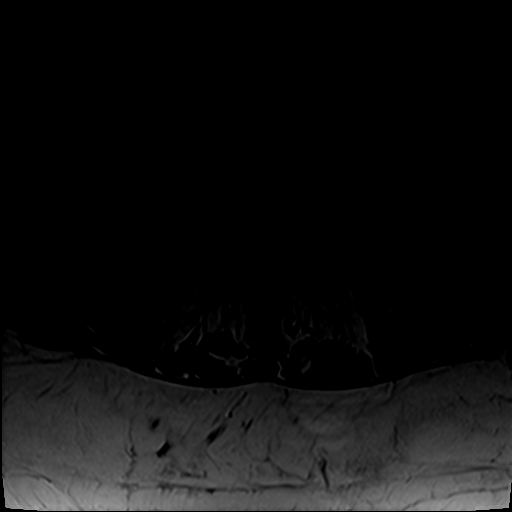
[im 7/49]
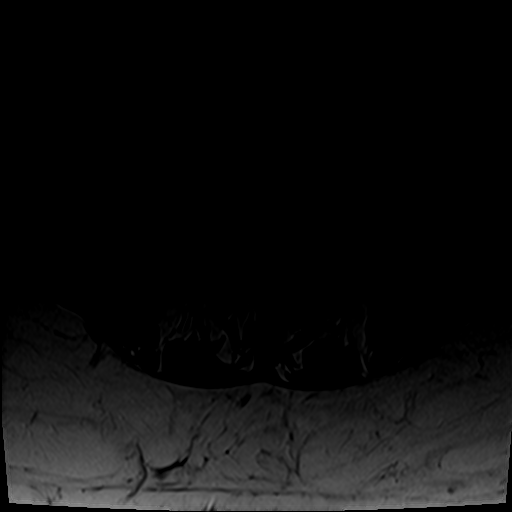
[im 10/49]
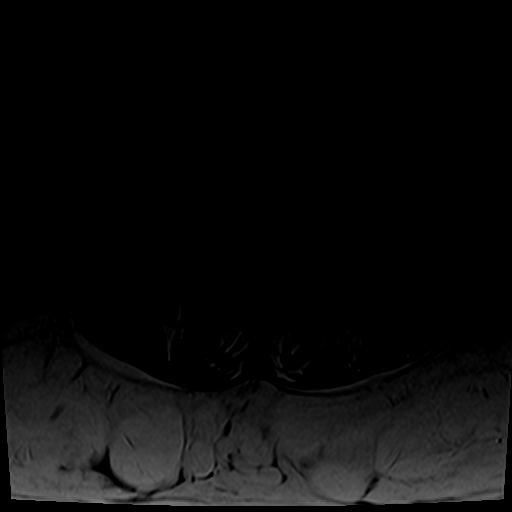
[im 17/49]
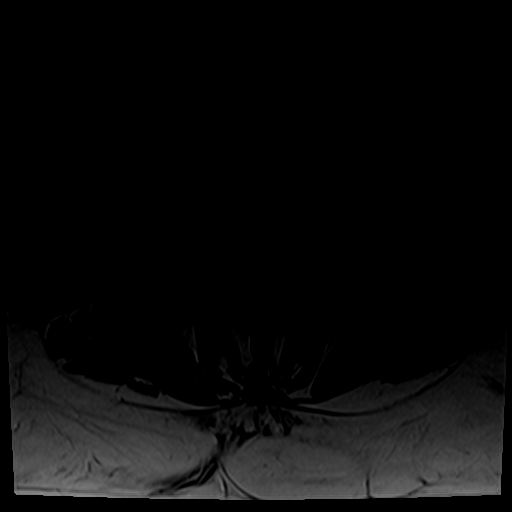
[im 26/49]
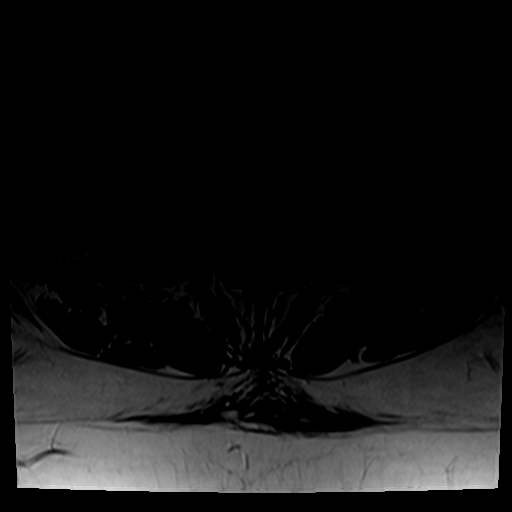
[im 42/49]
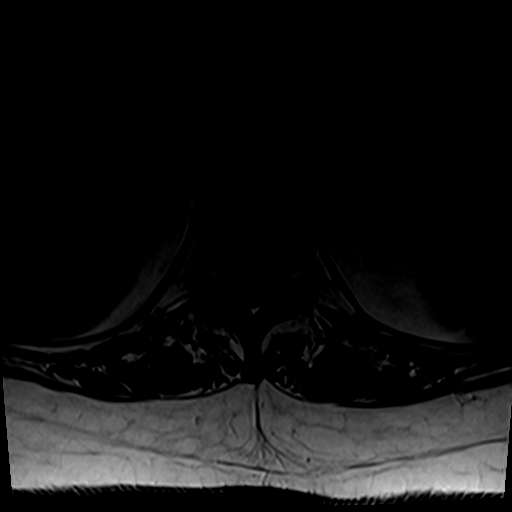

[26 of 48 positions shown; findings below may reference images not displayed]

FINDINGS: Segmentation:  Standard.

Alignment:  Physiologic.

Vertebrae: No fracture, evidence of discitis, or bone lesion. L4
superior endplate Schmorl's node that is chronic.

Conus medullaris and cauda equina: Conus extends to the L1-2 level.
Conus and cauda equina appear normal.

Paraspinal and other soft tissues: Negative

Disc levels:

Lower thoracic disc narrowing, spondylosis, and facet spurring.

T12- L1: Mild facet spurring.  No impingement

L1-L2: Disc narrowing and bulging.  Negative facets.  No impingement

L2-L3: Disc narrowing and mild bulging. Negative facets. No
impingement

L3-L4: Mild disc narrowing and desiccation.  No impingement

L4-L5: Disc narrowing and minor bulging. Degenerative posterior
element hypertrophy. There is triangular narrowing of the spinal
canal that is mild. No compressive stenosis seen.

L5-S1:Disc narrowing with a mild right preferential bulge.
Degenerative facet hypertrophy. No impingement.
IMPRESSION: Disc and facet degeneration as described. No impingement to explain
left leg symptoms.

## 2019-12-07 DIAGNOSIS — I10 Essential (primary) hypertension: Secondary | ICD-10-CM | POA: Diagnosis not present

## 2019-12-07 DIAGNOSIS — R002 Palpitations: Secondary | ICD-10-CM | POA: Diagnosis not present

## 2019-12-07 DIAGNOSIS — D510 Vitamin B12 deficiency anemia due to intrinsic factor deficiency: Secondary | ICD-10-CM | POA: Diagnosis not present

## 2019-12-07 DIAGNOSIS — E039 Hypothyroidism, unspecified: Secondary | ICD-10-CM | POA: Diagnosis not present

## 2019-12-07 DIAGNOSIS — R7309 Other abnormal glucose: Secondary | ICD-10-CM | POA: Diagnosis not present

## 2019-12-07 DIAGNOSIS — E781 Pure hyperglyceridemia: Secondary | ICD-10-CM | POA: Diagnosis not present

## 2019-12-21 DIAGNOSIS — E0590 Thyrotoxicosis, unspecified without thyrotoxic crisis or storm: Secondary | ICD-10-CM | POA: Diagnosis not present

## 2019-12-21 DIAGNOSIS — E039 Hypothyroidism, unspecified: Secondary | ICD-10-CM | POA: Diagnosis not present

## 2019-12-25 DIAGNOSIS — R079 Chest pain, unspecified: Secondary | ICD-10-CM | POA: Diagnosis not present

## 2019-12-28 DIAGNOSIS — R002 Palpitations: Secondary | ICD-10-CM | POA: Diagnosis not present

## 2019-12-28 DIAGNOSIS — D510 Vitamin B12 deficiency anemia due to intrinsic factor deficiency: Secondary | ICD-10-CM | POA: Diagnosis not present

## 2019-12-28 DIAGNOSIS — E039 Hypothyroidism, unspecified: Secondary | ICD-10-CM | POA: Diagnosis not present

## 2019-12-28 DIAGNOSIS — I10 Essential (primary) hypertension: Secondary | ICD-10-CM | POA: Diagnosis not present

## 2020-04-06 DIAGNOSIS — Z01419 Encounter for gynecological examination (general) (routine) without abnormal findings: Secondary | ICD-10-CM | POA: Diagnosis not present

## 2020-04-06 DIAGNOSIS — Z6841 Body Mass Index (BMI) 40.0 and over, adult: Secondary | ICD-10-CM | POA: Diagnosis not present

## 2020-04-06 DIAGNOSIS — Z79890 Hormone replacement therapy (postmenopausal): Secondary | ICD-10-CM | POA: Diagnosis not present

## 2020-04-12 DIAGNOSIS — E559 Vitamin D deficiency, unspecified: Secondary | ICD-10-CM | POA: Diagnosis not present

## 2020-04-12 DIAGNOSIS — E039 Hypothyroidism, unspecified: Secondary | ICD-10-CM | POA: Diagnosis not present

## 2020-05-20 DIAGNOSIS — M545 Low back pain: Secondary | ICD-10-CM | POA: Diagnosis not present

## 2020-05-20 DIAGNOSIS — M9902 Segmental and somatic dysfunction of thoracic region: Secondary | ICD-10-CM | POA: Diagnosis not present

## 2020-05-20 DIAGNOSIS — M9901 Segmental and somatic dysfunction of cervical region: Secondary | ICD-10-CM | POA: Diagnosis not present

## 2020-05-20 DIAGNOSIS — M546 Pain in thoracic spine: Secondary | ICD-10-CM | POA: Diagnosis not present

## 2020-05-20 DIAGNOSIS — M542 Cervicalgia: Secondary | ICD-10-CM | POA: Diagnosis not present

## 2020-05-20 DIAGNOSIS — M9903 Segmental and somatic dysfunction of lumbar region: Secondary | ICD-10-CM | POA: Diagnosis not present

## 2020-06-06 DIAGNOSIS — M9903 Segmental and somatic dysfunction of lumbar region: Secondary | ICD-10-CM | POA: Diagnosis not present

## 2020-06-06 DIAGNOSIS — M9901 Segmental and somatic dysfunction of cervical region: Secondary | ICD-10-CM | POA: Diagnosis not present

## 2020-06-06 DIAGNOSIS — M546 Pain in thoracic spine: Secondary | ICD-10-CM | POA: Diagnosis not present

## 2020-06-06 DIAGNOSIS — M542 Cervicalgia: Secondary | ICD-10-CM | POA: Diagnosis not present

## 2020-06-06 DIAGNOSIS — M9902 Segmental and somatic dysfunction of thoracic region: Secondary | ICD-10-CM | POA: Diagnosis not present

## 2020-06-06 DIAGNOSIS — M545 Low back pain: Secondary | ICD-10-CM | POA: Diagnosis not present

## 2020-06-20 DIAGNOSIS — M542 Cervicalgia: Secondary | ICD-10-CM | POA: Diagnosis not present

## 2020-06-20 DIAGNOSIS — M9902 Segmental and somatic dysfunction of thoracic region: Secondary | ICD-10-CM | POA: Diagnosis not present

## 2020-06-20 DIAGNOSIS — M545 Low back pain: Secondary | ICD-10-CM | POA: Diagnosis not present

## 2020-06-20 DIAGNOSIS — M546 Pain in thoracic spine: Secondary | ICD-10-CM | POA: Diagnosis not present

## 2020-06-20 DIAGNOSIS — M9903 Segmental and somatic dysfunction of lumbar region: Secondary | ICD-10-CM | POA: Diagnosis not present

## 2020-06-20 DIAGNOSIS — M9901 Segmental and somatic dysfunction of cervical region: Secondary | ICD-10-CM | POA: Diagnosis not present

## 2020-07-04 DIAGNOSIS — M25561 Pain in right knee: Secondary | ICD-10-CM | POA: Diagnosis not present

## 2020-07-04 DIAGNOSIS — M25562 Pain in left knee: Secondary | ICD-10-CM | POA: Diagnosis not present

## 2020-07-04 DIAGNOSIS — M25572 Pain in left ankle and joints of left foot: Secondary | ICD-10-CM | POA: Diagnosis not present

## 2020-07-04 DIAGNOSIS — M542 Cervicalgia: Secondary | ICD-10-CM | POA: Diagnosis not present

## 2020-07-04 DIAGNOSIS — M546 Pain in thoracic spine: Secondary | ICD-10-CM | POA: Diagnosis not present

## 2020-07-04 DIAGNOSIS — M545 Low back pain: Secondary | ICD-10-CM | POA: Diagnosis not present

## 2020-07-04 DIAGNOSIS — M9902 Segmental and somatic dysfunction of thoracic region: Secondary | ICD-10-CM | POA: Diagnosis not present

## 2020-07-04 DIAGNOSIS — M9901 Segmental and somatic dysfunction of cervical region: Secondary | ICD-10-CM | POA: Diagnosis not present

## 2020-07-04 DIAGNOSIS — M25571 Pain in right ankle and joints of right foot: Secondary | ICD-10-CM | POA: Diagnosis not present

## 2020-07-04 DIAGNOSIS — M9903 Segmental and somatic dysfunction of lumbar region: Secondary | ICD-10-CM | POA: Diagnosis not present

## 2020-07-21 DIAGNOSIS — M9902 Segmental and somatic dysfunction of thoracic region: Secondary | ICD-10-CM | POA: Diagnosis not present

## 2020-07-21 DIAGNOSIS — M545 Low back pain: Secondary | ICD-10-CM | POA: Diagnosis not present

## 2020-07-21 DIAGNOSIS — M25562 Pain in left knee: Secondary | ICD-10-CM | POA: Diagnosis not present

## 2020-07-21 DIAGNOSIS — M9903 Segmental and somatic dysfunction of lumbar region: Secondary | ICD-10-CM | POA: Diagnosis not present

## 2020-07-21 DIAGNOSIS — M9901 Segmental and somatic dysfunction of cervical region: Secondary | ICD-10-CM | POA: Diagnosis not present

## 2020-07-21 DIAGNOSIS — M546 Pain in thoracic spine: Secondary | ICD-10-CM | POA: Diagnosis not present

## 2020-07-21 DIAGNOSIS — M25561 Pain in right knee: Secondary | ICD-10-CM | POA: Diagnosis not present

## 2020-07-21 DIAGNOSIS — M25571 Pain in right ankle and joints of right foot: Secondary | ICD-10-CM | POA: Diagnosis not present

## 2020-07-21 DIAGNOSIS — M25572 Pain in left ankle and joints of left foot: Secondary | ICD-10-CM | POA: Diagnosis not present

## 2020-07-21 DIAGNOSIS — M542 Cervicalgia: Secondary | ICD-10-CM | POA: Diagnosis not present

## 2020-07-27 DIAGNOSIS — Z1231 Encounter for screening mammogram for malignant neoplasm of breast: Secondary | ICD-10-CM | POA: Diagnosis not present

## 2020-08-01 DIAGNOSIS — R002 Palpitations: Secondary | ICD-10-CM | POA: Diagnosis not present

## 2020-08-01 DIAGNOSIS — D510 Vitamin B12 deficiency anemia due to intrinsic factor deficiency: Secondary | ICD-10-CM | POA: Diagnosis not present

## 2020-08-01 DIAGNOSIS — E039 Hypothyroidism, unspecified: Secondary | ICD-10-CM | POA: Diagnosis not present

## 2020-08-01 DIAGNOSIS — E0590 Thyrotoxicosis, unspecified without thyrotoxic crisis or storm: Secondary | ICD-10-CM | POA: Diagnosis not present

## 2020-08-01 DIAGNOSIS — I10 Essential (primary) hypertension: Secondary | ICD-10-CM | POA: Diagnosis not present

## 2020-08-04 DIAGNOSIS — M542 Cervicalgia: Secondary | ICD-10-CM | POA: Diagnosis not present

## 2020-08-04 DIAGNOSIS — M9901 Segmental and somatic dysfunction of cervical region: Secondary | ICD-10-CM | POA: Diagnosis not present

## 2020-08-04 DIAGNOSIS — M25572 Pain in left ankle and joints of left foot: Secondary | ICD-10-CM | POA: Diagnosis not present

## 2020-08-04 DIAGNOSIS — M545 Low back pain: Secondary | ICD-10-CM | POA: Diagnosis not present

## 2020-08-04 DIAGNOSIS — M25571 Pain in right ankle and joints of right foot: Secondary | ICD-10-CM | POA: Diagnosis not present

## 2020-08-04 DIAGNOSIS — M9903 Segmental and somatic dysfunction of lumbar region: Secondary | ICD-10-CM | POA: Diagnosis not present

## 2020-08-04 DIAGNOSIS — M25561 Pain in right knee: Secondary | ICD-10-CM | POA: Diagnosis not present

## 2020-08-04 DIAGNOSIS — M9902 Segmental and somatic dysfunction of thoracic region: Secondary | ICD-10-CM | POA: Diagnosis not present

## 2020-08-04 DIAGNOSIS — M546 Pain in thoracic spine: Secondary | ICD-10-CM | POA: Diagnosis not present

## 2020-08-04 DIAGNOSIS — M25562 Pain in left knee: Secondary | ICD-10-CM | POA: Diagnosis not present

## 2020-08-18 DIAGNOSIS — M9902 Segmental and somatic dysfunction of thoracic region: Secondary | ICD-10-CM | POA: Diagnosis not present

## 2020-08-18 DIAGNOSIS — M9903 Segmental and somatic dysfunction of lumbar region: Secondary | ICD-10-CM | POA: Diagnosis not present

## 2020-08-18 DIAGNOSIS — M25571 Pain in right ankle and joints of right foot: Secondary | ICD-10-CM | POA: Diagnosis not present

## 2020-08-18 DIAGNOSIS — M542 Cervicalgia: Secondary | ICD-10-CM | POA: Diagnosis not present

## 2020-08-18 DIAGNOSIS — M25561 Pain in right knee: Secondary | ICD-10-CM | POA: Diagnosis not present

## 2020-08-18 DIAGNOSIS — M5416 Radiculopathy, lumbar region: Secondary | ICD-10-CM | POA: Diagnosis not present

## 2020-08-18 DIAGNOSIS — M25572 Pain in left ankle and joints of left foot: Secondary | ICD-10-CM | POA: Diagnosis not present

## 2020-08-18 DIAGNOSIS — M25562 Pain in left knee: Secondary | ICD-10-CM | POA: Diagnosis not present

## 2020-08-18 DIAGNOSIS — M9901 Segmental and somatic dysfunction of cervical region: Secondary | ICD-10-CM | POA: Diagnosis not present

## 2020-08-18 DIAGNOSIS — M546 Pain in thoracic spine: Secondary | ICD-10-CM | POA: Diagnosis not present

## 2020-08-25 DIAGNOSIS — I69322 Dysarthria following cerebral infarction: Secondary | ICD-10-CM | POA: Diagnosis not present

## 2020-08-25 DIAGNOSIS — Z0001 Encounter for general adult medical examination with abnormal findings: Secondary | ICD-10-CM | POA: Diagnosis not present

## 2020-08-25 DIAGNOSIS — R002 Palpitations: Secondary | ICD-10-CM | POA: Diagnosis not present

## 2020-08-25 DIAGNOSIS — D510 Vitamin B12 deficiency anemia due to intrinsic factor deficiency: Secondary | ICD-10-CM | POA: Diagnosis not present

## 2020-08-25 DIAGNOSIS — F4320 Adjustment disorder, unspecified: Secondary | ICD-10-CM | POA: Diagnosis not present

## 2020-08-25 DIAGNOSIS — E039 Hypothyroidism, unspecified: Secondary | ICD-10-CM | POA: Diagnosis not present

## 2020-08-25 DIAGNOSIS — I10 Essential (primary) hypertension: Secondary | ICD-10-CM | POA: Diagnosis not present

## 2020-08-25 DIAGNOSIS — R55 Syncope and collapse: Secondary | ICD-10-CM | POA: Diagnosis not present

## 2020-08-25 DIAGNOSIS — L989 Disorder of the skin and subcutaneous tissue, unspecified: Secondary | ICD-10-CM | POA: Diagnosis not present

## 2020-08-25 DIAGNOSIS — E559 Vitamin D deficiency, unspecified: Secondary | ICD-10-CM | POA: Diagnosis not present

## 2020-08-29 DIAGNOSIS — Z961 Presence of intraocular lens: Secondary | ICD-10-CM | POA: Diagnosis not present

## 2020-09-12 DIAGNOSIS — M9901 Segmental and somatic dysfunction of cervical region: Secondary | ICD-10-CM | POA: Diagnosis not present

## 2020-09-12 DIAGNOSIS — M25561 Pain in right knee: Secondary | ICD-10-CM | POA: Diagnosis not present

## 2020-09-12 DIAGNOSIS — M9902 Segmental and somatic dysfunction of thoracic region: Secondary | ICD-10-CM | POA: Diagnosis not present

## 2020-09-12 DIAGNOSIS — M9903 Segmental and somatic dysfunction of lumbar region: Secondary | ICD-10-CM | POA: Diagnosis not present

## 2020-09-12 DIAGNOSIS — M25572 Pain in left ankle and joints of left foot: Secondary | ICD-10-CM | POA: Diagnosis not present

## 2020-09-12 DIAGNOSIS — M5416 Radiculopathy, lumbar region: Secondary | ICD-10-CM | POA: Diagnosis not present

## 2020-09-12 DIAGNOSIS — M25562 Pain in left knee: Secondary | ICD-10-CM | POA: Diagnosis not present

## 2020-09-12 DIAGNOSIS — M25571 Pain in right ankle and joints of right foot: Secondary | ICD-10-CM | POA: Diagnosis not present

## 2020-09-12 DIAGNOSIS — M542 Cervicalgia: Secondary | ICD-10-CM | POA: Diagnosis not present

## 2020-09-12 DIAGNOSIS — M546 Pain in thoracic spine: Secondary | ICD-10-CM | POA: Diagnosis not present

## 2020-09-22 DIAGNOSIS — I10 Essential (primary) hypertension: Secondary | ICD-10-CM | POA: Diagnosis not present

## 2020-09-22 DIAGNOSIS — E039 Hypothyroidism, unspecified: Secondary | ICD-10-CM | POA: Diagnosis not present

## 2020-09-22 DIAGNOSIS — R55 Syncope and collapse: Secondary | ICD-10-CM | POA: Diagnosis not present

## 2020-09-22 DIAGNOSIS — D510 Vitamin B12 deficiency anemia due to intrinsic factor deficiency: Secondary | ICD-10-CM | POA: Diagnosis not present

## 2020-09-22 DIAGNOSIS — I69322 Dysarthria following cerebral infarction: Secondary | ICD-10-CM | POA: Diagnosis not present

## 2020-09-22 DIAGNOSIS — Z0001 Encounter for general adult medical examination with abnormal findings: Secondary | ICD-10-CM | POA: Diagnosis not present

## 2020-09-22 DIAGNOSIS — E559 Vitamin D deficiency, unspecified: Secondary | ICD-10-CM | POA: Diagnosis not present

## 2020-09-22 DIAGNOSIS — L989 Disorder of the skin and subcutaneous tissue, unspecified: Secondary | ICD-10-CM | POA: Diagnosis not present

## 2020-09-26 DIAGNOSIS — Z780 Asymptomatic menopausal state: Secondary | ICD-10-CM | POA: Diagnosis not present

## 2020-10-03 DIAGNOSIS — Z961 Presence of intraocular lens: Secondary | ICD-10-CM | POA: Diagnosis not present

## 2020-10-03 DIAGNOSIS — H26492 Other secondary cataract, left eye: Secondary | ICD-10-CM | POA: Diagnosis not present

## 2020-10-11 DIAGNOSIS — Z9842 Cataract extraction status, left eye: Secondary | ICD-10-CM | POA: Diagnosis not present

## 2020-10-12 DIAGNOSIS — M546 Pain in thoracic spine: Secondary | ICD-10-CM | POA: Diagnosis not present

## 2020-10-12 DIAGNOSIS — M9901 Segmental and somatic dysfunction of cervical region: Secondary | ICD-10-CM | POA: Diagnosis not present

## 2020-10-12 DIAGNOSIS — M9903 Segmental and somatic dysfunction of lumbar region: Secondary | ICD-10-CM | POA: Diagnosis not present

## 2020-10-12 DIAGNOSIS — M25572 Pain in left ankle and joints of left foot: Secondary | ICD-10-CM | POA: Diagnosis not present

## 2020-10-12 DIAGNOSIS — M25571 Pain in right ankle and joints of right foot: Secondary | ICD-10-CM | POA: Diagnosis not present

## 2020-10-12 DIAGNOSIS — M25562 Pain in left knee: Secondary | ICD-10-CM | POA: Diagnosis not present

## 2020-10-12 DIAGNOSIS — M542 Cervicalgia: Secondary | ICD-10-CM | POA: Diagnosis not present

## 2020-10-12 DIAGNOSIS — M5416 Radiculopathy, lumbar region: Secondary | ICD-10-CM | POA: Diagnosis not present

## 2020-10-12 DIAGNOSIS — M9902 Segmental and somatic dysfunction of thoracic region: Secondary | ICD-10-CM | POA: Diagnosis not present

## 2020-10-12 DIAGNOSIS — M25561 Pain in right knee: Secondary | ICD-10-CM | POA: Diagnosis not present

## 2020-10-17 DIAGNOSIS — H26491 Other secondary cataract, right eye: Secondary | ICD-10-CM | POA: Diagnosis not present

## 2020-10-26 DIAGNOSIS — Z9841 Cataract extraction status, right eye: Secondary | ICD-10-CM | POA: Diagnosis not present

## 2021-12-14 ENCOUNTER — Other Ambulatory Visit: Payer: Self-pay

## 2021-12-14 ENCOUNTER — Ambulatory Visit (INDEPENDENT_AMBULATORY_CARE_PROVIDER_SITE_OTHER): Payer: Medicare (Managed Care) | Admitting: Internal Medicine

## 2021-12-14 ENCOUNTER — Encounter: Payer: Self-pay | Admitting: Internal Medicine

## 2021-12-14 VITALS — BP 130/70 | HR 94 | Temp 98.1°F | Ht 62.0 in | Wt 271.4 lb

## 2021-12-14 DIAGNOSIS — I2602 Saddle embolus of pulmonary artery with acute cor pulmonale: Secondary | ICD-10-CM

## 2021-12-14 NOTE — Progress Notes (Signed)
Wendy Lane    [id number]    November 09, 1950  Primary Care Physician:Richter, Maebelle Munroe, MD  Referring Physician: Hayden Rasmussen, MD [address],  [place] [address] Reason for Consultation: pulmonary embolism Date of Consultation: 12/14/2021  Chief complaint:   Chief Complaint  Patient presents with   Consult    Pulm embolism     HPI: Wendy Lane is a 71 y.o. woman who presents for new patient evaluation for pulmonary embolism.   She was hospitalized for 4 days, she was treated with a heparin drip and She was discharged on xarelto, changed to eliquis two days ago due to feeling light headed on xarelto and having drug rash. No bleeding issues.   The PE was felt to be provoked secondary to estrogen supplementation. She was also sick 3 weeks prior to the PE with a bad URI -was not moving around very much then. She has stopped estrogen supplementation. No family history or previous history of VTE.   HR getting up into the 110s. Not as active as she was previous to December.    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  Exposures: lives in [place], two cats.  Smoking history: never smoker  Social History   Occupational History   Occupation: grief specalist  Tobacco Use   Smoking status: Never   Smokeless tobacco: Never  Vaping Use   Vaping Use: Never used  Substance and Sexual Activity   Alcohol use: Yes    Alcohol/week: 0.0 standard drinks    Comment: social(maybe 2 a year)   Drug use: No   Sexual activity: Not on file    Relevant family history:  Family History  Problem Relation Age of Onset   Lung cancer Father        smoker   Allergies Unknown        mother and father side of family   Diabetes Brother    Asthma Neg Hx    Allergic rhinitis Neg Hx    Eczema Neg Hx    Immunodeficiency Neg Hx    Urticaria Neg Hx    Angioedema Neg Hx     Past Medical History:  Diagnosis Date   Allergy     Hypertension     Past Surgical History:  Procedure Laterality Date   ADENOIDECTOMY  1958   BREAST LUMPECTOMY  1970's   CHOLECYSTECTOMY  1987   FOOT SURGERY  2010   torn tendon   MYOMECTOMY  1993   d/c   TONSILLECTOMY  1958   TUBAL LIGATION  1977     Physical Exam: Blood pressure 130/70, pulse 94, temperature 98.1 F (36.7 C), temperature source Oral, height 5\' 2"  (1.575 m), weight 271 lb 6.4 oz (123.1 kg), SpO2 96 %. Gen:      No acute distress, obese ENT:  no nasal polyps, mucus membranes moist Lungs:    No increased respiratory effort, symmetric chest wall excursion, clear to auscultation bilaterally, no wheezes or crackles CV:         Regular rate and rhythm; no murmurs, rubs, or gallops.  No pedal edema Abd:      + bowel sounds; soft, non-tender; no distension MSK: no acute synovitis of DIP or PIP joints, no mechanics hands.  Skin:      Warm and dry; no rashes Neuro: normal speech, no focal facial asymmetry Psych: alert and oriented x3, normal mood and affect  Data Reviewed/Medical Decision Making:  Independent interpretation of tests: Imaging:  Review of patient's chest xray April 2016 images revealed no acute disease, right middle lung nodule, stable. The patient's images have been independently reviewed by me.    CTPE study Jan 2023 IMPRESSION: 1. Acute pulmonary emboli within the distal right main pulmonary artery extending into segmental and subsegmental branches of the right upper lobe, right lower lobe and right middle lobe. Segmental and subsegmental pulmonary emboli in the left upper lobe and left lower lobe. Positive for acute PE with CTevidence of right heart strain (RV/LV Ratio = 1.9) consistent with at least submassive (intermediate risk) PE. The presence of right heart strain has been associated with an increased risk of morbidity and mortality. 2. Calcified granulomas in the right lung.   Lower extremity dopplers negative for VTE  PFTs:  No flowsheet  data found.  Labs: [facility] Nov 06 2021 reviewed - wnl Troponin and BNP Jan 2023 elevated Covid negative CBC with leukocytosis and polycythemia  Immunization status:  Immunization History  Administered Date(s) Administered   Fluad Quad(high Dose 65+) 08/24/2021   Janssen (J&J) SARS-COV-2 Vaccination 02/11/2020   PPD Test 10/08/2014   [place] Covid Bivalent Pediatric Vaccine(70mos to <75yrs) 11/01/2020, 04/04/2021     I reviewed prior external note(s) from Cardiology, hospital stay at [facility]  I reviewed the result(s) of the labs and imaging as noted above.   I have ordered echocardiogram  Assessment:  Acute Submassive Pulmonary Embolism, provoked OSA on CPAP -  Tachycardia - likely residual from PE   Plan/Recommendations:  Continue eliquis Will plan for echocardiogram Early April - 90 days following initial PE to evaluate for resolution of right heart strain.  Activity is as tolerated.  If this is indeed a provoked PE from relatively immobility and estrogen use, then would consider 6 months of AC. However, if her activity level doesn't improve from what it is currently, lifelong AC may be better.   We discussed disease management and progression at length today.    Return to Care: Return in about 2 months (around 02/11/2022).  Lenice Llamas, MD Pulmonary and [place]  CC: Hayden Rasmussen, MD

## 2021-12-14 NOTE — Patient Instructions (Signed)
Please schedule follow up scheduled with myself in 2 months.  If my schedule is not open yet, we will contact you with a reminder closer to that time. Please call [phone] if you haven't heard from [place] a month before.   Before your next visit I would like you to have: echocardiogram

## 2022-01-15 ENCOUNTER — Telehealth: Payer: Self-pay | Admitting: Internal Medicine

## 2022-01-15 NOTE — Telephone Encounter (Signed)
Called and spoke with patient, she is currently wearing a heart monitor until Friday, 01/19/22 and was wondering if they can do the echo with that in place.  She states it is stuck on very well and would not want to go through having it done again.  She is scheduled to have the echo done at [facility].  She left a message with their offie and is awaiting a return call.  I advised her to speak with them and if it is going to be a problem to have it rescheduled after she had the heart monitor taken off as soon as she can.  She verbalized understanding.  Nothing further needed. ?

## 2022-01-17 ENCOUNTER — Ambulatory Visit (HOSPITAL_BASED_OUTPATIENT_CLINIC_OR_DEPARTMENT_OTHER): Payer: Medicare (Managed Care)

## 2022-02-14 ENCOUNTER — Telehealth: Payer: Self-pay | Admitting: Internal Medicine

## 2022-02-14 ENCOUNTER — Ambulatory Visit (HOSPITAL_BASED_OUTPATIENT_CLINIC_OR_DEPARTMENT_OTHER)
Admission: RE | Admit: 2022-02-14 | Discharge: 2022-02-14 | Disposition: A | Discharge status: Home / Self Care | Payer: Medicare (Managed Care) | Source: Ambulatory Visit | Attending: Internal Medicine | Admitting: Internal Medicine

## 2022-02-14 DIAGNOSIS — I2602 Saddle embolus of pulmonary artery with acute cor pulmonale: Secondary | ICD-10-CM

## 2022-02-14 NOTE — Telephone Encounter (Signed)
ATC patient who is calling about additional testing with her ECHO.  Per DPR left detailed message advising her that looking at the last OV note and telephone notes I don't see where any additional testing is mentioned. I do see mention of her having the heart monitor but that's all. Advised her that she can call the office back with any questions. Nothing further needed at this time. ?

## 2022-02-14 NOTE — Progress Notes (Signed)
?  Echocardiogram ?2D Echocardiogram has been performed. ? ?Elmer Ramp ?02/14/2022, 3:18 PM ?

## 2022-02-15 LAB — ECHOCARDIOGRAM COMPLETE
AR max vel: 1.8 cm2
AV Area VTI: 1.89 cm2
AV Area mean vel: 1.75 cm2
AV Mean grad: 7 mmHg
AV Peak grad: 12.7 mmHg
Ao pk vel: 1.78 m/s
Area-P 1/2: 4.39 cm2
S' Lateral: 2.5 cm

## 2022-02-27 ENCOUNTER — Encounter: Payer: Self-pay | Admitting: Internal Medicine

## 2022-02-27 ENCOUNTER — Ambulatory Visit (INDEPENDENT_AMBULATORY_CARE_PROVIDER_SITE_OTHER): Payer: Medicare (Managed Care) | Admitting: Internal Medicine

## 2022-02-27 VITALS — BP 116/78 | HR 60 | Ht 62.0 in | Wt 277.0 lb

## 2022-02-27 DIAGNOSIS — L270 Generalized skin eruption due to drugs and medicaments taken internally: Secondary | ICD-10-CM

## 2022-02-27 DIAGNOSIS — I2609 Other pulmonary embolism with acute cor pulmonale: Secondary | ICD-10-CM

## 2022-02-27 NOTE — Progress Notes (Signed)
? ?      ?Wendy Lane    [id number]    07-07-1951 ? ?Primary Care Physician:Richter, Maebelle Munroe, MD ?Date of Appointment: 02/27/2022 ?Established Patient Visit ? ?Chief complaint:   ?Chief Complaint  ?Patient presents with  ? Follow-up  ?  F/U after echo. States she has been doing well since last visit. Denies any new symptoms.   ? ? ? ?HPI: ?Wendy Lane is a 71 y.o. woman with history of Acute PE with cor pulmonale in Jan 2023.  ?Provoked secondary to estrogen supplementation ? ?Interval Updates: ?Here for follow up after echocardiogram.  ?Feeling much better. Has been started on BB by heart doctor and now is in the high 50s.  ? ?She feels out of shape and deconditioned over the last 4 months and feels that she is gained weight. Wants to start exercising and getting her stamina back.  ? ?No le edema. ? ?She notes a rash that is itching that she has had on chest and arms since she was discharged from hospital back in January. She was initially on xarelto but this was changed to eliquis. Rash started around that time.  ? ?I have reviewed the patient's family social and past medical history and updated as appropriate.  ? ?Past Medical History:  ?Diagnosis Date  ? Allergy   ? Hypertension   ? ? ?Past Surgical History:  ?Procedure Laterality Date  ? ADENOIDECTOMY  1958  ? BREAST LUMPECTOMY  1970's  ? CHOLECYSTECTOMY  1987  ? FOOT SURGERY  2010  ? torn tendon  ? MYOMECTOMY  1993  ? d/c  ? TONSILLECTOMY  1958  ? TUBAL LIGATION  1977  ? ? ?Family History  ?Problem Relation Age of Onset  ? Lung cancer Father   ?     smoker  ? Allergies Unknown   ?     mother and father side of family  ? Diabetes Brother   ? Asthma Neg Hx   ? Allergic rhinitis Neg Hx   ? Eczema Neg Hx   ? Immunodeficiency Neg Hx   ? Urticaria Neg Hx   ? Angioedema Neg Hx   ? ? ?Social History  ? ?Occupational History  ? Occupation: grief specalist  ?Tobacco Use  ? Smoking status: Never  ? Smokeless tobacco: Never  ?Vaping Use  ? Vaping Use: Never  used  ?Substance and Sexual Activity  ? Alcohol use: Yes  ?  Alcohol/week: 0.0 standard drinks  ?  Comment: social(maybe 2 a year)  ? Drug use: No  ? Sexual activity: Not on file  ? ? ? ?Physical Exam: ?Blood pressure 116/78, pulse 60, height '5\' 2"'$  (1.575 m), weight 277 lb (125.6 kg), SpO2 97 %. ? ?Gen:      No acute distress ?Lungs:    No increased respiratory effort, symmetric chest wall excursion, clear to auscultation bilaterally, no wheezes or crackles ?CV:         Regular rate and rhythm; no murmurs, rubs, or gallops.  No pedal edema ?MSK:   papular erythematous rash on trunk, chest and arms. susp ? ?Data Reviewed: ?Imaging: ?I have personally reviewed the  ? ?Echocardiogram - no rv strain ? ?PFTs: ? ?   ? View : No data to display.  ?  ?  ?  ? ? ? ?Labs: ? ?Immunization status: ?Immunization History  ?Administered Date(s) Administered  ? Fluad Quad(high Dose 65+) 08/24/2021  ? Janssen (J&J) SARS-COV-2 Vaccination 02/11/2020  ? PPD Test  10/08/2014  ? [place] Pediatric Vaccine(34mo to <598yr 11/01/2020, 04/04/2021  ? ? ?External Records Personally Reviewed: cardiology ? ?Assessment:  ?Acute PE provoked from estrogen use with right heart strain ?Drug rash ? ?Plan/Recommendations: ?Would stop eliquis since it has been over 3 months and she might be having a drug rash.  ?Echo shows normal heart function.  I think it safe to discontinue based on chest vte guidelines.  ?Try 1% hydrocortisone for rash and follow up with PCP if it doesn't resolve with discontinuation of eliquis.  ? ? ?Return to Care: ?Return if symptoms worsen or fail to improve. ? ? ?NiLenice LlamasMD ?Pulmonary and Critical Care Medicine ?[phone] ? ? ? ? ? ?

## 2022-02-27 NOTE — Patient Instructions (Signed)
Come see me as needed.  ? ?Stop Eliquis. Your rash could be related to this. You can try hydrocortisone 1% cream for the itching.  ? ?Follow up with Dr. Darron Doom if it doesn't resolve.  ? ?You can exercise as tolerated, without restrictions.  ? ?Come back and see me if issues with breathing or anything changes.  ?

## 2022-06-27 ENCOUNTER — Ambulatory Visit (INDEPENDENT_AMBULATORY_CARE_PROVIDER_SITE_OTHER): Payer: Medicare (Managed Care) | Admitting: Internal Medicine

## 2022-06-27 ENCOUNTER — Encounter: Payer: Self-pay | Admitting: Internal Medicine

## 2022-06-27 VITALS — BP 132/72 | HR 68 | Temp 98.2°F | Resp 18 | Wt 258.0 lb

## 2022-06-27 DIAGNOSIS — L308 Other specified dermatitis: Secondary | ICD-10-CM | POA: Diagnosis not present

## 2022-06-27 DIAGNOSIS — K9089 Other intestinal malabsorption: Secondary | ICD-10-CM

## 2022-06-27 DIAGNOSIS — R21 Rash and other nonspecific skin eruption: Secondary | ICD-10-CM

## 2022-06-27 MED ORDER — CLOBETASOL PROPIONATE 0.05 % EX OINT: 1.0000 | TOPICAL_OINTMENT | Freq: Two times a day (BID) | CUTANEOUS | 0 refills | Status: AC

## 2022-06-27 NOTE — Patient Instructions (Addendum)
Dermatitis  Testing today showed: Skin prick was positive to cockroach, intradermal's were positive to cat and borderline to [place] grass, ragweed mix, molds, dust mite - I do not think this is a drug rash based on distribution  - Contact dermatitis is a possibility and you may have been sensitized to a contact allergen during your hospital stay  - Patch testing is the diagnostic tool for contact dermatitis  - Patches are best placed on Monday with return to office on Wednesday and Friday of same week for readings.  Patches once placed should not get wet.  You do not have to stop any medications for patch testing but should not be on oral prednisone. You can schedule a patch testing visit when convenient for your schedule.    Treatment:  Start Clobetasol 0.05% cream twice a day until skin texture returns to normal.  Do not use on face, armpit or groin  Stop neosporin and 90% alcohol  Use gentle moisturizer such as cetaphil, vanicream or   Chronic Diarrhea  Food testing was negative today to common foods  I do not think your chronic diarrhea is caused by a food allergy however many people can have food intolerances  Food Intolerance - start an elimination diet by identifying any particular food or food group that may worsen your symptoms, eliminate these foods, and slowly try reintroducing after at least a 2-4 week period, if symptoms return, this is a food you should avoid - the symptoms you are having are not consistent with a life-threatening food allergy, and there is no allergy testing at this time that can identify food intolerances - consider GI and nutrition referral for help with underlying symptoms and diet  Follow up: 4 weeks   Thank you so much for letting me partake in your care today.  Don't hesitate to reach out if you have any additional concerns!  Roney [place], MD  [facility] [place]

## 2022-06-27 NOTE — Progress Notes (Signed)
New Patient Note  RE: Wendy Lane MRN: [id number] DOB: [birth year] Date of Office Visit: 06/27/2022  Consult requested by: Hayden Rasmussen, MD Primary care provider: Hayden Rasmussen, MD  Chief Complaint: Rash (Pt states she develop a rash on her chest and arms)  History of Present Illness: I had the pleasure of seeing Wendy Lane for initial evaluation at the [facility] [place] on 06/27/2022. She is a 71 y.o. female, who is referred here by Hayden Rasmussen, MD for the evaluation of rash .  History obtained from patient  and  chart review .  Symptoms started after she was hospitalized in the ICU for for bilateral Pe's which were treated with anticoagulation.  A few weeks after discharge she developed pruritic, erythematous papules  on chest and arms.  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  Initially concerned for xarelto, but symptoms continued when she was transitioned to eliquis.  She has not had patch testing in the past.  She stopped eliquis in April and symptoms have persisted.   She has not tried topical corticosteroids.  Home treatments include neosporin and 90% alcohol, which she feels like helps somewhat.    She has a history of chronic diarrhea and had skin testing in 2018 which was negative to [place] foods.  Intradermal testing was positive to grass pollen, ragweed pollen, tree pollen, mold, cat, dust mite and skin prick positive to cockroach.  She is interested in updating this testing  She also reports a history of IgG testing positive to egg and avoids egg, dairy and gluten for control of diarrhea   Assessment and Plan: Wendy Lane is a 71 y.o. female with: Other specified dermatitis - Plan: Allergy Test, Interdermal Allergy Test  Other specified intestinal malabsorption - Plan: Allergy Test Plan: Patient Instructions   Dermatitis  Testing today showed: Skin prick was positive to cockroach,  intradermal's were positive to cat and borderline to [place] grass, ragweed mix, molds, dust mite - I do not think this is a drug rash based on distribution  - Contact dermatitis is a possibility and you may have been sensitized to a contact allergen during your hospital stay  - Patch testing is the diagnostic tool for contact dermatitis  - Patches are best placed on Monday with return to office on Wednesday and Friday of same week for readings.  Patches once placed should not get wet.  You do not have to stop any medications for patch testing but should not be on oral prednisone. You can schedule a patch testing visit when convenient for your schedule.    Treatment:  Start Clobetasol 0.05% cream twice a day until skin texture returns to normal.  Do not use on face, armpit or groin  Stop neosporin and 90% alcohol  Use gentle moisturizer such as cetaphil, vanicream or   Chronic Diarrhea  Food testing was negative today to common foods  I do not think your chronic diarrhea is caused by a food allergy however many people can have food intolerances  Food Intolerance - start an elimination diet by identifying any particular food or food group that may worsen your symptoms, eliminate these foods, and slowly try reintroducing after at least a 2-4 week period, if symptoms return, this is a food you should avoid - the symptoms you are having are not consistent with a life-threatening food allergy, and there is no allergy testing at this  time that can identify food intolerances - consider GI and nutrition referral for help with underlying symptoms and diet  Follow up: 4 weeks   Thank you so much for letting me partake in your care today.  Don't hesitate to reach out if you have any additional concerns!  Roney [place], MD  [facility] [place]  No follow-ups on file.  Meds ordered this encounter  Medications   clobetasol ointment (TEMOVATE) 0.05 %    Sig: Apply 1  Application topically 2 (two) times daily.    Dispense:  30 g    Refill:  0   Lab Orders  No laboratory test(s) ordered today    Other allergy screening: Asthma: no Rhino conjunctivitis: no Food allergy: yes Medication allergy:  maybe Hymenoptera allergy: no Urticaria: no Eczema:no History of recurrent infections suggestive of immunodeficency: no  Diagnostics: Skin Testing: Environmental allergy panel and select foods. Skin prick test was positive to cockroach negative to all foods; intradermal testing was positive to cat and borderline to [place] grass, ragweed mix, molds, dust mite Results interpreted by myself and discussed with patient/family.  Airborne Adult Perc - 06/27/22 1431     Time Antigen Placed 1431    Allergen Manufacturer Lavella Hammock    Location Back    Number of Test 59    Panel 1 Select    1. Control-Buffer 50% Glycerol Negative    2. Control-Histamine 1 mg/ml 4+    3. Albumin saline Negative    4. Candler-McAfee Negative    5. [place] Negative    6. Johnson Negative    7. [place]ano Blue Negative    8. Meadow Fescue Negative    9. Perennial Rye Negative    10. Sweet Vernal Negative    11. Timothy Negative    12. Cocklebur Negative    13. Burweed Marshelder Negative    14. Ragweed, short Negative    15. Ragweed, Giant Negative    16. Plantain,  English Negative    17. Lamb's Quarters Negative    18. Sheep Sorrell Negative    19. Rough Pigweed Negative    20. Marsh Elder, Rough Negative    21. Mugwort, Common Negative    22. Ash mix Negative    23. Birch mix Negative    24. Beech American Negative    25. Box, Elder Negative    26. Cedar, red Negative    27. Cottonwood, [place] Negative    28. Elm mix Negative    29. Hickory Negative    30. Maple mix Negative    31. Oak, [place] mix Negative    32. Pecan Pollen Negative    33. Pine mix Negative    34. Sycamore Eastern Negative    35. Donaldson, Black Pollen Negative    36. Alternaria alternata Negative     37. Cladosporium Herbarum Negative    38. Aspergillus mix Negative    39. Penicillium mix Negative    40. Bipolaris sorokiniana (Helminthosporium) Negative    41. Drechslera spicifera (Curvularia) Negative    42. Mucor plumbeus Negative    43. Fusarium moniliforme Negative    44. Aureobasidium pullulans (pullulara) Negative    45. Rhizopus oryzae Negative    46. Botrytis cinera Negative    47. Epicoccum nigrum Negative    48. Phoma betae Negative    49. Candida Albicans Negative    50. Trichophyton mentagrophytes Negative    51. Mite, D Farinae  5,000 AU/ml Negative  52. Mite, D Pteronyssinus  5,000 AU/ml Negative    53. Cat Hair 10,000 BAU/ml Negative    54.  Dog Epithelia Negative    55. Mixed Feathers Negative    56. Horse Epithelia Negative    57. Cockroach, German 3+    58. Mouse Negative    59. Tobacco Leaf Negative             Food Perc - 06/27/22 1431       Test Information   Time Antigen Placed 5400    Allergen Manufacturer Lavella Hammock    Location Back    Number of allergen test [place]   1. Peanut Negative    2. Soybean food Negative    3. Wheat, whole Negative    4. Sesame Negative    5. Milk, cow Negative    6. Egg White, chicken Negative    7. Casein Negative    8. Shellfish mix Negative    9. Fish mix Negative    10. Cashew Negative             Intradermal - 06/27/22 1534     Time Antigen Placed 1534    Allergen Manufacturer Lavella Hammock    Location Arm    Number of Test 14    Intradermal Select    Control Negative    Guatem[place] 2+    Johnson Negative    7 Grass Negative    Ragweed mix Negative    Weed mix 2+    Tree mix Negative    Mold 1 Negative    Mold 2 2+    Mold 3 2+    Mold 4 2+    Cat 3+    Dog Negative    Mite mix 2+             Past Medical History: Patient Active Problem List   Diagnosis Date Noted   Perennial and seasonal allergic rhinitis 03/14/2017   History of food allergy 03/14/2017    Eustachian tube dysfunction 03/14/2017   Mycobacterium infection, 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     clobetasol ointment (TEMOVATE) 8.67 % Apply 1 Application topically 2 (two) times daily. 30 g 0   cyanocobalamin (,VITAMIN B-12,) 1000 MCG/ML injection Injection weekly  0   ELIQUIS 5 MG TABS tablet Take 5 mg by mouth 2 (two) times daily.     hydrochlorothiazide (HYDRODIURIL) 12.5 MG tablet Take 12.5 mg by mouth daily.     levothyroxine (SYNTHROID, LEVOTHROID) 112 MCG tablet Take 1 tablet by mouth daily. Pt taking 31mg  11   losartan (COZAAR) 100 MG tablet 100 mg daily.  8   magnesium gluconate (MAGONATE) 500 MG tablet Take 500 mg by mouth 2 (two) times daily.     Melatonin 10 MG TBCR Take by mouth.  Omega-3 Fatty Acids (OMEGA 3 PO) Take 1,200 mg by mouth daily.     vitamin A 10000 UNIT capsule Take 10,000 Units by mouth daily.     VITAMIN E PO Take 832 mg by mouth daily.     No current facility-administered medications for this visit.   Allergies: Allergies  Allergen Reactions   Diflucan [Fluconazole] Nausea And Vomiting   Sulfa Antibiotics     As a child-? Itching feelings   Social History: Social History   Socioeconomic History   Marital status: Divorced    Spouse name: Not on file   Number of children: 0   Years of education: Not on file   Highest education level: Not on file  Occupational History   Occupation: grief  specalist  Tobacco Use   Smoking status: Never   Smokeless tobacco: Never  Vaping Use   Vaping Use: Never used  Substance and Sexual Activity   Alcohol use: Yes    Alcohol/week: 0.0 standard drinks of alcohol    Comment: social(maybe 2 a year)   Drug use: No   Sexual activity: Not on file  Other Topics Concern   Not on file  Social History Narrative   Not on file   Social Determinants of Health   Financial Resource Strain: Not on file  Food Insecurity: Not on file  Transportation Needs: Not on file  Physical Activity: Not on file  Stress: Not on file  Social Connections: Not on file   Lives in a condo that was built in 1987.  There are no roaches in the house but is not 2 feet off the floor.  There are no dust mite precautions on better pillows.  She is not exposed to fumes, chemicals or dust.  There is a HEPA filter in the home and home is not near an interstate industrial area. 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   Objective: BP 132/72   Pulse 68   Temp 98.2 F (36.8 C) (Temporal)   Resp 18   Wt 258 lb (117 kg)   SpO2 96%   BMI 47.19 kg/m  Body mass index is 47.19 kg/m.  General Appearance:  Alert, cooperative, no distress, appears stated age  Head:  Normocephalic, without obvious abnormality, atraumatic  Eyes:  Conjunctiva clear, EOM's intact   Nose: Nares normal, normal mucosa, no visible anterior polyps, and septum midline  Throat: Lips, tongue normal; teeth and gums normal, normal posterior oropharynx and no tonsillar exudate  Neck: Supple, symmetrical  Lungs:   clear to auscultation bilaterally, Respirations unlabored, no coughing  Heart:  regular rate and rhythm and no murmur, Appears well perfused  Extremities: No edema  Skin: Skin color, texture, turgor normal,  erythematous papules on chest and upper arms with evidence of excoriation and cirrhosis  Neurologic: No gross deficits   The plan was reviewed with the patient/family, and all questions/concerned were addressed.  It was my pleasure to see Wendy Lane today and participate in her care. Please feel free to contact me with any questions or concerns.  Sincerely,  Roney [place], MD Allergy & Immunology  [facility] office: [phone] [facility] office: [phone]

## 2022-07-30 ENCOUNTER — Ambulatory Visit (INDEPENDENT_AMBULATORY_CARE_PROVIDER_SITE_OTHER): Payer: Medicare (Managed Care) | Admitting: Internal Medicine

## 2022-07-30 DIAGNOSIS — L2389 Allergic contact dermatitis due to other agents: Secondary | ICD-10-CM | POA: Diagnosis not present

## 2022-07-30 NOTE — Progress Notes (Signed)
    Follow-up Note  RE: [name] MRN: [id number] DOB: Dec 01, 1950 Date of Office Visit: 07/30/2022  Primary care provider: Hayden Rasmussen, MD Referring provider: Hayden Rasmussen, MD   Wendy Lane returns to the office today for the patch test placement, given suspected history of contact dermatitis.    Diagnostics: True Test patches placed.    Plan:   Allergic contact dermatitis - Instructions provided on care of the patches for the next 48 hours. Wendy Lane was instructed to avoid showering for the next 48 hours. Wendy Lane will follow up in 48 hours and 96 hours for patch readings.    Roney [place], MD [facility]

## 2022-08-01 ENCOUNTER — Ambulatory Visit: Payer: Medicare (Managed Care) | Admitting: Internal Medicine

## 2022-08-01 DIAGNOSIS — L2389 Allergic contact dermatitis due to other agents: Secondary | ICD-10-CM

## 2022-08-01 NOTE — Progress Notes (Signed)
   Follow Up Note  RE: Wendy Lane MRN: [id number] DOB: 10-19-1951 Date of Office Visit: 08/01/2022  Referring provider: Hayden Rasmussen, MD Primary care provider: Hayden Rasmussen, MD  History of Present Illness: I had the pleasure of seeing Wendy Lane for a follow up visit at the [facility] [place] [facility] [place] on 08/01/2022. She is a 71 y.o. female, who is being followed for allergic contact dermatitis . Today she is here for initial patch test interpretation, given suspected history of contact dermatitis.   Diagnostics:  TRUE TEST 48 hour reading: negative to all   T.R.U.E. Test - 08/01/22 1200       Test Information   Time Antigen Placed 1219    Manufacturer Other    Location Back    Number of Test 36    Reading Interval Day 3      Panel 1   1. Nickel Sulfate 0    2. Wool Alcohols 0    3. Neomycin Sulfate 0    4. Potassium Dichromate 0    5. Caine Mix 0    6. Fragrance Mix 0    7. Colophony 0    8. Paraben Mix 0    9. Negative Control 0    10. Balsam of [place] 0    11. Ethylenediamine Dihydrochloride 0    12. Cobalt Dichloride 0      Panel 2   13. p-tert Butylphenol Formaldehyde Resin 0    14. Epoxy Resin 0    15. Carba Mix 0    16.  Black Rubber Mix 0    17. Cl+ Me-Isothiazolinone 0    18. Quaternium-15 0    19. Methyldibromo Glutaronitrile 0    20. p-Phenylenediamine 0    21. Formaldehyde 0    22. Mercapto Mix 0    23. Thimerosal 0    24. Thiuram Mix 0      Panel 3   25. Diazolidinyl Urea 0    26. Quinoline Mix 0    27. Tixocortol-21-Pivalate 0    28. Gold Sodium Thiosulfate 0    29. Imidazolidinyl Urea 0    30. Budesonide 0    31. Hydrocortisone-17-Butyrate 0    32. Mercaptobenzothiazole 0    33. Bacitracin 0    34. Parthenolide 0    35. Disperse Blue 106 0    36. 2-Bromo-2-Nitropropane-1,3-diol 0              Assessment and Plan: Wendy Lane is a 71 y.o. female with: Concern for Contact Dermatitis:  The patient has been provided  detailed information regarding the substances she is sensitive to, as well as products containing the substances.  Meticulous avoidance of these substances is recommended. If avoidance is not possible, the use of barrier creams or lotions is recommended. If symptoms persist or progress despite meticulous avoidance of chemicals/substances above, dermatology evaluation may be warranted. No follow-ups on file.  It was my pleasure to see Wendy Lane today and participate in her care. Please feel free to contact me with any questions or concerns.  Sincerely,   Roney [place], MD [facility] [place]

## 2022-08-02 NOTE — Progress Notes (Signed)
   Follow Up Note  RE: Wendy Lane MRN: [id number] DOB: 1951/09/16 Date of Office Visit: 08/03/2022  Referring provider: Hayden Rasmussen, MD Primary care provider: Hayden Rasmussen, MD  History of Present Illness: I had the pleasure of seeing Wendy Lane for a follow up visit at the [facility] [place] [facility] [place] on 08/02/2022. She is a 71 y.o. female, who is being followed for concern for allergic contact dermatitis. Today she is here for final patch test interpretation, given suspected history of contact dermatitis.   Her 48-hour patch test read was negative.  Diagnostics:  TRUE TEST 96 hour reading: Gold    Assessment and Plan: Wendy Lane is a 71 y.o. female with: Concern for Contact Dermatitis:  The patient has been provided detailed information regarding the substances she is sensitive to, as well as products containing the substances.  Meticulous avoidance of these substances is recommended. If avoidance is not possible, the use of barrier creams or lotions is recommended. If symptoms persist or progress despite meticulous avoidance of chemicals/substances above, dermatology evaluation may be warranted.  Follow-up with Dr Edison Pace in 2-3 months  It was my pleasure to see Wendy Lane today and participate in her care. Please feel free to contact me with any questions or concerns.  Sincerely,   Sigurd Sos, MD [facility]

## 2022-08-03 ENCOUNTER — Ambulatory Visit: Payer: Medicare (Managed Care) | Admitting: Internal Medicine

## 2022-08-03 ENCOUNTER — Telehealth: Payer: Self-pay

## 2022-08-03 ENCOUNTER — Encounter: Payer: Self-pay | Admitting: Internal Medicine

## 2022-08-03 DIAGNOSIS — L230 Allergic contact dermatitis due to metals: Secondary | ICD-10-CM

## 2022-08-03 NOTE — Telephone Encounter (Signed)
Mailed TRUE test gold information

## 2024-06-15 ENCOUNTER — Telehealth: Payer: Self-pay

## 2024-06-15 NOTE — Telephone Encounter (Signed)
 Left message stating that the provider requesting the Echo, should place the order for the Echocardiogram.  Call back number left

## 2024-06-15 NOTE — Telephone Encounter (Signed)
 Referral was placed for the patient to have an Echo completed. Are we able to create an order for the patient to have the echo scheduled?

## 2024-06-17 NOTE — Telephone Encounter (Signed)
 Spoke with pt who stated her PCP had requested she have an echo completed. Explained to pt that because her PCP is with [facility], we will need them to sign and fax us  an order to be able to get this scheduled with us . Pt stated she would like to stay within [facility]. Pt advised to reach out to PCP office to see if there is someone in [facility] system she would like to refer her to or let PCP know if she does want pt to continue to seek our care, the order for the echo will need to be signed and faxed to us . Pt verbalized understanding of information discussed and was provided our fax number to give to PCP if needed.

## 2024-06-17 NOTE — Telephone Encounter (Signed)
 Pt returning nurses call regarding order for Echo. Please advise

## 2024-09-02 ENCOUNTER — Ambulatory Visit (HOSPITAL_BASED_OUTPATIENT_CLINIC_OR_DEPARTMENT_OTHER): Payer: Medicare (Managed Care) | Admitting: Cardiology
